# Patient Record
Sex: Female | Born: 1975 | Race: White | Hispanic: Yes | Smoking: Former smoker
Health system: Southern US, Community
[De-identification: ages and names within clinical notes are randomized; demographics above are authoritative.]

## PROBLEM LIST (undated history)

## (undated) ENCOUNTER — Inpatient Hospital Stay (HOSPITAL_COMMUNITY): Payer: Self-pay

## (undated) DIAGNOSIS — F32A Depression, unspecified: Secondary | ICD-10-CM

## (undated) DIAGNOSIS — K029 Dental caries, unspecified: Secondary | ICD-10-CM

## (undated) DIAGNOSIS — R519 Headache, unspecified: Secondary | ICD-10-CM

## (undated) DIAGNOSIS — R51 Headache: Secondary | ICD-10-CM

## (undated) DIAGNOSIS — O283 Abnormal ultrasonic finding on antenatal screening of mother: Secondary | ICD-10-CM

## (undated) DIAGNOSIS — Z789 Other specified health status: Secondary | ICD-10-CM

## (undated) DIAGNOSIS — F329 Major depressive disorder, single episode, unspecified: Secondary | ICD-10-CM

## (undated) HISTORY — PX: MULTIPLE TOOTH EXTRACTIONS: SHX2053

## (undated) HISTORY — PX: ECTOPIC PREGNANCY SURGERY: SHX613

## (undated) HISTORY — DX: Depression, unspecified: F32.A

## (undated) HISTORY — DX: Headache: R51

## (undated) HISTORY — DX: Major depressive disorder, single episode, unspecified: F32.9

## (undated) HISTORY — DX: Headache, unspecified: R51.9

---

## 2000-04-14 ENCOUNTER — Encounter: Payer: Self-pay | Admitting: Emergency Medicine

## 2000-04-14 ENCOUNTER — Inpatient Hospital Stay (HOSPITAL_COMMUNITY): Admission: AD | Admit: 2000-04-14 | Discharge: 2000-04-14 | Payer: Self-pay | Admitting: *Deleted

## 2000-04-15 ENCOUNTER — Inpatient Hospital Stay (HOSPITAL_COMMUNITY): Admission: AD | Admit: 2000-04-15 | Discharge: 2000-04-15 | Payer: Self-pay | Admitting: Obstetrics and Gynecology

## 2000-04-16 ENCOUNTER — Encounter: Payer: Self-pay | Admitting: Obstetrics and Gynecology

## 2000-04-16 ENCOUNTER — Inpatient Hospital Stay (HOSPITAL_COMMUNITY): Admission: AD | Admit: 2000-04-16 | Discharge: 2000-04-17 | Payer: Self-pay | Admitting: Obstetrics and Gynecology

## 2000-04-16 ENCOUNTER — Encounter (INDEPENDENT_AMBULATORY_CARE_PROVIDER_SITE_OTHER): Payer: Self-pay | Admitting: Specialist

## 2000-04-19 ENCOUNTER — Inpatient Hospital Stay (HOSPITAL_COMMUNITY): Admission: AD | Admit: 2000-04-19 | Discharge: 2000-04-19 | Payer: Self-pay | Admitting: *Deleted

## 2001-11-05 ENCOUNTER — Encounter: Admission: RE | Admit: 2001-11-05 | Discharge: 2001-11-05 | Payer: Self-pay | Admitting: Family Medicine

## 2001-11-10 ENCOUNTER — Ambulatory Visit (HOSPITAL_COMMUNITY): Admission: RE | Admit: 2001-11-10 | Discharge: 2001-11-10 | Payer: Self-pay | Admitting: *Deleted

## 2001-11-18 ENCOUNTER — Encounter: Admission: RE | Admit: 2001-11-18 | Discharge: 2001-11-18 | Payer: Self-pay | Admitting: Family Medicine

## 2001-12-01 ENCOUNTER — Encounter: Admission: RE | Admit: 2001-12-01 | Discharge: 2001-12-01 | Payer: Self-pay | Admitting: Family Medicine

## 2001-12-22 ENCOUNTER — Ambulatory Visit (HOSPITAL_COMMUNITY): Admission: RE | Admit: 2001-12-22 | Discharge: 2001-12-22 | Payer: Self-pay | Admitting: Family Medicine

## 2002-01-01 ENCOUNTER — Encounter: Admission: RE | Admit: 2002-01-01 | Discharge: 2002-01-01 | Payer: Self-pay | Admitting: Family Medicine

## 2002-01-13 ENCOUNTER — Ambulatory Visit (HOSPITAL_COMMUNITY): Admission: RE | Admit: 2002-01-13 | Discharge: 2002-01-13 | Payer: Self-pay | Admitting: Family Medicine

## 2002-01-27 ENCOUNTER — Ambulatory Visit (HOSPITAL_COMMUNITY): Admission: RE | Admit: 2002-01-27 | Discharge: 2002-01-27 | Payer: Self-pay | Admitting: *Deleted

## 2002-01-27 ENCOUNTER — Encounter: Payer: Self-pay | Admitting: *Deleted

## 2002-02-02 ENCOUNTER — Encounter: Admission: RE | Admit: 2002-02-02 | Discharge: 2002-02-02 | Payer: Self-pay | Admitting: Family Medicine

## 2002-02-28 ENCOUNTER — Inpatient Hospital Stay (HOSPITAL_COMMUNITY): Admission: AD | Admit: 2002-02-28 | Discharge: 2002-03-02 | Payer: Self-pay | Admitting: *Deleted

## 2002-03-05 ENCOUNTER — Encounter: Admission: RE | Admit: 2002-03-05 | Discharge: 2002-03-05 | Payer: Self-pay | Admitting: Family Medicine

## 2002-03-13 ENCOUNTER — Encounter: Admission: RE | Admit: 2002-03-13 | Discharge: 2002-03-13 | Payer: Self-pay | Admitting: Family Medicine

## 2002-03-18 ENCOUNTER — Encounter: Admission: RE | Admit: 2002-03-18 | Discharge: 2002-03-18 | Payer: Self-pay | Admitting: Family Medicine

## 2002-03-25 ENCOUNTER — Encounter: Admission: RE | Admit: 2002-03-25 | Discharge: 2002-03-25 | Payer: Self-pay | Admitting: Family Medicine

## 2002-04-10 ENCOUNTER — Encounter: Admission: RE | Admit: 2002-04-10 | Discharge: 2002-04-10 | Payer: Self-pay | Admitting: Family Medicine

## 2002-04-23 ENCOUNTER — Encounter: Admission: RE | Admit: 2002-04-23 | Discharge: 2002-04-23 | Payer: Self-pay | Admitting: Family Medicine

## 2002-05-06 ENCOUNTER — Encounter: Admission: RE | Admit: 2002-05-06 | Discharge: 2002-05-06 | Payer: Self-pay | Admitting: Family Medicine

## 2002-05-13 ENCOUNTER — Encounter: Admission: RE | Admit: 2002-05-13 | Discharge: 2002-05-13 | Payer: Self-pay | Admitting: Sports Medicine

## 2002-05-14 ENCOUNTER — Ambulatory Visit (HOSPITAL_COMMUNITY): Admission: RE | Admit: 2002-05-14 | Discharge: 2002-05-14 | Payer: Self-pay | Admitting: Sports Medicine

## 2002-05-22 ENCOUNTER — Encounter: Admission: RE | Admit: 2002-05-22 | Discharge: 2002-05-22 | Payer: Self-pay | Admitting: Family Medicine

## 2002-05-22 ENCOUNTER — Inpatient Hospital Stay (HOSPITAL_COMMUNITY): Admission: AD | Admit: 2002-05-22 | Discharge: 2002-05-22 | Payer: Self-pay | Admitting: Family Medicine

## 2002-05-25 ENCOUNTER — Encounter: Admission: RE | Admit: 2002-05-25 | Discharge: 2002-05-25 | Payer: Self-pay | Admitting: Family Medicine

## 2002-05-29 ENCOUNTER — Encounter (HOSPITAL_COMMUNITY): Admission: RE | Admit: 2002-05-29 | Discharge: 2002-06-05 | Payer: Self-pay | Admitting: *Deleted

## 2002-06-03 ENCOUNTER — Encounter: Admission: RE | Admit: 2002-06-03 | Discharge: 2002-06-03 | Payer: Self-pay | Admitting: Family Medicine

## 2002-06-05 ENCOUNTER — Inpatient Hospital Stay (HOSPITAL_COMMUNITY): Admission: AD | Admit: 2002-06-05 | Discharge: 2002-06-08 | Payer: Self-pay | Admitting: Family Medicine

## 2002-07-10 ENCOUNTER — Encounter: Admission: RE | Admit: 2002-07-10 | Discharge: 2002-07-10 | Payer: Self-pay | Admitting: Family Medicine

## 2002-09-01 ENCOUNTER — Encounter: Admission: RE | Admit: 2002-09-01 | Discharge: 2002-09-01 | Payer: Self-pay | Admitting: Family Medicine

## 2002-10-07 ENCOUNTER — Encounter: Admission: RE | Admit: 2002-10-07 | Discharge: 2002-10-07 | Payer: Self-pay | Admitting: Family Medicine

## 2002-11-17 ENCOUNTER — Encounter: Admission: RE | Admit: 2002-11-17 | Discharge: 2002-11-17 | Payer: Self-pay | Admitting: Family Medicine

## 2003-04-03 ENCOUNTER — Emergency Department (HOSPITAL_COMMUNITY): Admission: EM | Admit: 2003-04-03 | Discharge: 2003-04-03 | Payer: Self-pay | Admitting: Emergency Medicine

## 2004-04-13 ENCOUNTER — Ambulatory Visit: Payer: Self-pay | Admitting: Family Medicine

## 2004-04-17 ENCOUNTER — Ambulatory Visit (HOSPITAL_COMMUNITY): Admission: RE | Admit: 2004-04-17 | Discharge: 2004-04-17 | Payer: Self-pay | Admitting: Family Medicine

## 2004-05-16 ENCOUNTER — Ambulatory Visit: Payer: Self-pay | Admitting: Family Medicine

## 2004-05-19 ENCOUNTER — Ambulatory Visit (HOSPITAL_COMMUNITY): Admission: RE | Admit: 2004-05-19 | Discharge: 2004-05-19 | Payer: Self-pay | Admitting: *Deleted

## 2004-06-05 ENCOUNTER — Ambulatory Visit: Payer: Self-pay | Admitting: Family Medicine

## 2004-06-19 ENCOUNTER — Ambulatory Visit (HOSPITAL_COMMUNITY): Admission: RE | Admit: 2004-06-19 | Discharge: 2004-06-19 | Payer: Self-pay | Admitting: Family Medicine

## 2004-06-30 ENCOUNTER — Ambulatory Visit: Payer: Self-pay | Admitting: Sports Medicine

## 2004-07-11 ENCOUNTER — Ambulatory Visit: Payer: Self-pay | Admitting: Family Medicine

## 2004-07-21 ENCOUNTER — Ambulatory Visit: Payer: Self-pay | Admitting: Family Medicine

## 2004-07-27 ENCOUNTER — Ambulatory Visit: Payer: Self-pay | Admitting: Family Medicine

## 2004-08-03 ENCOUNTER — Ambulatory Visit: Payer: Self-pay | Admitting: Family Medicine

## 2004-08-08 ENCOUNTER — Inpatient Hospital Stay (HOSPITAL_COMMUNITY): Admission: AD | Admit: 2004-08-08 | Discharge: 2004-08-08 | Payer: Self-pay | Admitting: *Deleted

## 2004-08-08 ENCOUNTER — Ambulatory Visit: Payer: Self-pay | Admitting: Family Medicine

## 2004-08-10 ENCOUNTER — Encounter (INDEPENDENT_AMBULATORY_CARE_PROVIDER_SITE_OTHER): Payer: Self-pay | Admitting: *Deleted

## 2004-08-10 ENCOUNTER — Ambulatory Visit: Payer: Self-pay | Admitting: Family Medicine

## 2004-08-10 ENCOUNTER — Ambulatory Visit: Payer: Self-pay | Admitting: Obstetrics & Gynecology

## 2004-08-10 ENCOUNTER — Inpatient Hospital Stay (HOSPITAL_COMMUNITY): Admission: AD | Admit: 2004-08-10 | Discharge: 2004-08-12 | Payer: Self-pay | Admitting: Obstetrics & Gynecology

## 2004-10-19 ENCOUNTER — Ambulatory Visit: Payer: Self-pay | Admitting: Family Medicine

## 2004-12-28 ENCOUNTER — Ambulatory Visit: Payer: Self-pay | Admitting: Sports Medicine

## 2005-05-11 ENCOUNTER — Ambulatory Visit: Payer: Self-pay | Admitting: Sports Medicine

## 2005-05-18 ENCOUNTER — Ambulatory Visit: Payer: Self-pay | Admitting: Family Medicine

## 2005-06-01 ENCOUNTER — Ambulatory Visit: Payer: Self-pay | Admitting: Family Medicine

## 2005-06-07 ENCOUNTER — Ambulatory Visit: Payer: Self-pay | Admitting: Sports Medicine

## 2005-06-12 ENCOUNTER — Encounter: Admission: RE | Admit: 2005-06-12 | Discharge: 2005-06-12 | Payer: Self-pay | Admitting: Sports Medicine

## 2005-06-22 ENCOUNTER — Ambulatory Visit: Payer: Self-pay | Admitting: Family Medicine

## 2006-07-11 ENCOUNTER — Ambulatory Visit: Payer: Self-pay | Admitting: Sports Medicine

## 2006-07-23 ENCOUNTER — Encounter (INDEPENDENT_AMBULATORY_CARE_PROVIDER_SITE_OTHER): Payer: Self-pay | Admitting: *Deleted

## 2006-07-23 LAB — CONVERTED CEMR LAB

## 2006-08-15 ENCOUNTER — Encounter (INDEPENDENT_AMBULATORY_CARE_PROVIDER_SITE_OTHER): Payer: Self-pay | Admitting: Family Medicine

## 2006-08-15 ENCOUNTER — Ambulatory Visit: Payer: Self-pay | Admitting: Family Medicine

## 2006-09-20 ENCOUNTER — Encounter (INDEPENDENT_AMBULATORY_CARE_PROVIDER_SITE_OTHER): Payer: Self-pay | Admitting: *Deleted

## 2007-02-19 LAB — OB RESULTS CONSOLE VARICELLA ZOSTER ANTIBODY, IGG: Varicella: IMMUNE

## 2007-02-19 LAB — SICKLE CELL SCREEN: SICKLE CELL SCREEN: NORMAL

## 2007-02-27 ENCOUNTER — Ambulatory Visit (HOSPITAL_COMMUNITY): Admission: RE | Admit: 2007-02-27 | Discharge: 2007-02-27 | Payer: Self-pay | Admitting: Family Medicine

## 2007-06-27 ENCOUNTER — Ambulatory Visit (HOSPITAL_COMMUNITY): Admission: RE | Admit: 2007-06-27 | Discharge: 2007-06-27 | Payer: Self-pay | Admitting: Obstetrics & Gynecology

## 2007-07-02 ENCOUNTER — Ambulatory Visit: Payer: Self-pay | Admitting: Obstetrics & Gynecology

## 2007-07-05 ENCOUNTER — Inpatient Hospital Stay (HOSPITAL_COMMUNITY): Admission: AD | Admit: 2007-07-05 | Discharge: 2007-07-07 | Payer: Self-pay | Admitting: Obstetrics & Gynecology

## 2007-07-05 ENCOUNTER — Ambulatory Visit: Payer: Self-pay | Admitting: *Deleted

## 2007-07-11 ENCOUNTER — Encounter (INDEPENDENT_AMBULATORY_CARE_PROVIDER_SITE_OTHER): Payer: Self-pay | Admitting: Family Medicine

## 2009-06-21 ENCOUNTER — Encounter: Payer: Self-pay | Admitting: *Deleted

## 2009-06-21 ENCOUNTER — Emergency Department (HOSPITAL_COMMUNITY): Admission: EM | Admit: 2009-06-21 | Discharge: 2009-06-21 | Payer: Self-pay | Admitting: Emergency Medicine

## 2009-07-21 ENCOUNTER — Encounter (INDEPENDENT_AMBULATORY_CARE_PROVIDER_SITE_OTHER): Payer: Self-pay | Admitting: Family Medicine

## 2009-07-21 ENCOUNTER — Ambulatory Visit: Payer: Self-pay | Admitting: Family Medicine

## 2010-08-13 ENCOUNTER — Encounter: Payer: Self-pay | Admitting: Obstetrics & Gynecology

## 2010-12-08 NOTE — Discharge Summary (Signed)
[facility]  Patient:    Amanda Beard                     MRN: [id number] Adm. Date:  [id number] Disc. Date: [id number] Attending:  Cleatrice Burke                           Discharge Summary  DISCHARGE DIAGNOSES:          1. Right cornual ectopic pregnancy.                               2. Hemoperitoneum.                               3. Anemia.  PROCEDURES THIS ADMISSION:    On April 16, 2000:                               1. Diagnostic laparoscopy.                               2. Exploratory laparotomy with right cornual                                  resection.  HISTORY OF PRESENT ILLNESS:   Ms. Amanda Beard is a 35 year old female, gravida 1 para 0, who had the presumed diagnosis of an ectopic pregnancy.  She was treated with methotrexate on April 15, 2000.  She presented to the hospital on the day of admission complaining of severe lower abdominal pain. Please see her dictated history and physical exam for details.  ADMISSION PHYSICAL EXAMINATION:                  The abdomen was markedly tender and there was rebound tenderness.  HOSPITAL COURSE:              The patient had an ultrasound performed, which showed a questionable mass in the adnexa, and fluid in the cul-de-sac consistent with a hemoperitoneum.  The decision was made to proceed with diagnostic laparoscopy because of a presumed ruptured ectopic pregnancy.  At the time of laparoscopy, the patient was found to have a cornual ectopic pregnancy on the right that measured 4 cm.  There was a 50 cc hemoperitoneum present.  We then proceed with exploratory laparotomy with a right cornual resection.  The patient tolerated her procedure well.  She remained afebrile postoperatively.  Her postoperative hemoglobin was 9.7 (preoperative hemoglobin 11.9).  She quickly tolerated a regular diet.  She was discharged to home on April 17, 2000, doing well.  DISCHARGE MEDICATIONS:         The patient was given Vicodin for pain.  FOLLOW-UP:                    She will return to see Dr. Stefano Gaul in four to six weeks for follow-up examination. DD:  06/05/00 TD:  06/05/00 Job: [id number]

## 2010-12-08 NOTE — Discharge Summary (Signed)
NAME:  Amanda Beard                ACCOUNT NO.:  [id number]   MEDICAL RECORD NO.:  [id number]                   PATIENT TYPE:  OUT   LOCATION:  ULT                                  FACILITY:  [facility]   PHYSICIAN:  Dianah Field, M.D.                 DATE OF BIRTH:  05/28/1976   DATE OF ADMISSION:  02/28/2002  DATE OF DISCHARGE:  03/02/2002                                 DISCHARGE SUMMARY   DISCHARGE DIAGNOSES:  1. Suspected preterm labor.  2. Intrauterine pregnancy at 87 and 6/7 weeks.   HOSPITAL COURSE:  This is a 35 year old G2, P0-0-1-0 who presented to  maternity admissions unit with complaints of abdominal pain and vaginal  discharge.  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  The patient's initial examination  revealed a negative wet prep, positive ferning, and negative pooling.  Digital cervical examination revealed closed cervix.  The patient was  admitted for presumed preterm rupture of membranes.  The patient received  two doses of betamethasone, first dose on February 28, 2002 and second dose on  March 01, 2002.  She was placed on strict bed rest and monitored for  uterine contractions.  The patient did not have any continued leakage of  fluid and tocometry was negative for any contractions.  Fetal heart tones  were within normal range.  Repeat speculum examination on March 02, 2002  was negative for pooling, Nitrazine negative, and negative for ferning.  Follow-up ultrasound revealed no significant change in AFI.  The patient was  currently stable for discharge home and presumptive diagnosis of preterm  premature rupture of membranes is currently suspected; however, confirmation  has not been repeated.  The patient will be followed closely as an  outpatient and has pelvic rest restrictions.  She is not discharged on any  oral medications aside from prenatal vitamins.   DATA:  CBC:  Hemoglobin  12.8, hematocrit 37.9, white blood cell count 8.7,  platelet count 182,000.  Urinalysis is negative.  Wet prep:  Moderate white  blood cells, negative for clue cells, yeast, and [place].  Repeat wet  prep was negative aside from two white blood cells seen.  Group B Strep is  negative.  RPR is nonreactive.  GC and Chlamydia are negative.   Initial ultrasound obtained February 28, 2002:  Fetus appears to have fitus  inversus with the stomach on the right and the apex of the heart on the  right side.  AFI is estimated at 16.1 cm.  Cervix is 3.2 cm measured  translabially.  Mean gestational age is consistent with 28 weeks.   Repeat __________  OB ultrasound March 02, 2002:  Normal AFI estimated at  15.1 cm, cervix 3.0 cm.   DISCHARGE MEDICATIONS:  Prenatal vitamins.   DISCHARGE INSTRUCTIONS:  The patient is to follow up at [facility] in one week and will call for this  appointment.  She is to maintain  strict pelvic rest including nothing per vagina and no sexual intercourse.  She has no restrictions on her work or activity otherwise.  She is to return  to triage or [facility] for any leakage of fluid, any fevers, or  any other concerns.                                               Dianah Field, M.D.   SG/MEDQ  D:  03/02/2002  T:  03/04/2002  Job:  [id number]

## 2010-12-08 NOTE — Op Note (Signed)
[facility]  Patient:    Amanda Beard                     MRN: [id number] Proc. Date: 04/16/00 Adm. Date:  [id number] Disc. Date: [id number] Attending:  Dierdre Forth Pearline                           Operative Report  PREOPERATIVE DIAGNOSIS:       Ruptured right ectopic pregnancy.  POSTOPERATIVE DIAGNOSIS:      Right cornual ectopic pregnancy with a                               hemoperitoneum.  OPERATION:                    1. Diagnostic laparoscopy.                               2. Right cornual resection.  SURGEON:                      Janine Limbo, M.D.  ASSISTANT:                    Wynelle Bourgeois, C.N.M.  ANESTHESIA:                   General anesthesia  ESTIMATED BLOOD LOSS:         50 cc.  INDICATIONS:                  The patient is a 35 year old female, gravida 1, para 0, who was diagnosed with an ectopic pregnancy on April 14, 2000. She was given methotrexate.  The patient reports that at noon today she began having severe abdominal pain.  She presented to [facility] where she had an ultrasound performed that showed a mass on the right but also showed fluid in the cul-de-sac. Through an interpreter, we carefully explained the possibility that she had a ruptured right ectopic pregnancy and we made plans to proceed with diagnostic laparoscopy and possible laparoscopic salpingectomy.  The risks and benefits of her procedure were reviewed including, but not limited to, anesthetic complications, bleeding, infections, and possible damage to the surrounding organs.  FINDINGS:                     The patient had a 4 cm ectopic pregnancy in the right cornual region of the uterus. There was a 50 cc hemoperitoneum present. The ovaries appeared normal.  The uterus otherwise appeared normal.  The patients blood type is O positive.  The patients hemoglobin on the day of this surgery was 11.9.  Her white blood cell count was  9000.  Her platelet count was 207,000.  It is my recommendation that should this patient get pregnant in the future, that a cesarean section be performed.  DESCRIPTION OF PROCEDURE:     The patient was taken to the operating room where a general anesthetic was given.  The patients abdomen, perineum, and vagina were prepped with multiple layers of Betadine. Examination under anesthesia was performed.  A Foley catheter was placed in the bladder.  A Hulka tenaculum was placed inside the uterus. The patient was then sterilely draped.  The subumbilical area was injected  with 0.25% Marcaine.  An incision was made in the subumbilical area and the Verres needle was inserted without difficulty. Proper placement was confirmed using the saline drop test.  A pneumoperitoneum was then obtained. The laparoscopic trocar and then the laparoscope were substituted for the Verres needle. The pelvic structures were visualized with findings as mentioned above.  Pictures were taken of the patients anatomy. The decision was made to proceed with laparotomy with cornual resection. The patients abdomen was then opened through a small Pfannenstiel incision.  The incision was extended through the subcutaneous tissue, the fascia and the anterior peritoneum.  An abdominal wall retractor was placed. The cornual pregnancy on the right was then isolated using interrupted sutures of 0 PDS.  The pregnancy was then sharply excised. Hemostasis was achieved by tying the PDS sutures and also using figure-of-eight sutures of 0 Vicryl.  The pelvis was then vigorously irrigated.  Hemostasis was noted to be adequate.  All instruments were removed.  The fascia was closed using a running suture of 0 Vicryl.  The subcutaneous tissue was irrigated.  Hemostasis was adequate.  The skin was reapproximated using skin staples.  Sponge, needle and instrument counts were correct on two occasions.  The estimated blood loss was 50 cc.  The  patient tolerated the procedure well.  She was awakened from her anesthetic and taken to the recovery room in stable condition. DD:  04/16/00 TD:  04/17/00 Job: [id number]

## 2010-12-08 NOTE — H&P (Signed)
[facility]  Patient:    Amanda Beard                     MRN: [id number] Adm. Date:  [id number] Attending:  Shaune Spittle Dictator:   Amanda Beard, C.N.M.                         History and Physical  DATE OF BIRTH:                May 28, 1976  HISTORY OF PRESENT ILLNESS:   Ms. Amanda Beard is a 35 year old gravida 1, para 0 with a diagnosed ectopic pregnancy on ultrasound this week who presents with sudden onset of severe lower abdominal pain at approximately 12 noon today. She also reports a small amount of vaginal bleeding. She received her first dose of methotrexate on April 15, 2000. She has a known O positive blood type. She is writhing in pain on her arrival. She had an ultrasound at [facility] through the ER on September 23 showing a 3 x 4 cm structure at the apex of the uterus felt to represent an ectopic pregnancy.  LABORATORY DATA:              Blood type is O positive. CBC is pending at this time. CBC on April 14, 2000, showed a hemoglobin of 12.7 and a hematocrit of 36.3, WBC count of 6.9, platelet count of 201. She had a quantitative HCG which showed a value of 4938 at that time. Urine showed 15, no ketones, large blood. She had a negative wet prep. GC and Chlamydia cultures were done at that time which are still pending.  ALLERGIES:                    None.  CURRENT MEDICATIONS:          None.  PAST MEDICAL HISTORY:         She has had no previous gynecological abnormalities. She has no history of diabetes, hypertension, thyroid disease, or any gastrointestinal problems. She has never had any surgery.  SOCIAL HISTORY:               She is single. Her brother and her friend are currently with her. The patient does not speak English, neither does her brother. Her friend Varney Baas does speak English. The patient denies any alcohol or drug use. She does smoke approximately two cigarettes per  day.  FAMILY HISTORY:               Not significant for hypertension, diabetes, thyroid issues, or any other problems, particularly any difficulty with anesthesia.  PHYSICAL EXAMINATION:  VITAL SIGNS:                  Stable; the patient is afebrile.  HEENT:                        Within normal limits.  LUNGS:                        Breath sounds are clear.  HEART:                        Regular rate and rhythm without murmur.  BREASTS:  Soft and nontender.  ABDOMEN:                      Markedly tender to minimal palpation. Faint bowel sounds are noted. There is difficulty in differentiating generalized abdominal tenderness from rebound but it is difficult to do a full abdominal exam secondary to the patients pain issues.  PELVIC:                       Deferred, except for a small amount of vaginal bleeding noted.  EXTREMITIES:                  Deep tendon reflexes are 2+ without clonus. There is a trace to no edema noted.  IMPRESSION:                   1. Ectopic pregnancy.                               2. Sudden-onset, severe abdominal pain.  PLAN:                         1. Consulted with Janine Limbo, M.D. as todays on-call attending physician.                               2. IV of ______ initiated.                               3. CBC and ______ drawn.                               4. Stadol 1 mg IV was given.                               5. Consent form signed for diagnostic laparoscopy with possible salpingectomy. Risks and benefits of this procedure were reviewed with the patient via her friend as interpreter.                               6. Janine Limbo, M.D. will see and further determine plan of care. DD:  04/16/00 TD:  04/16/00 Job: [id number]

## 2011-04-27 LAB — HERPES SIMPLEX VIRUS CULTURE: Culture: NOT DETECTED

## 2011-04-30 LAB — CBC
HCT: 31 — ABNORMAL LOW
HCT: 35.4 — ABNORMAL LOW
Hemoglobin: 10.8 — ABNORMAL LOW
Hemoglobin: 12.2
MCHC: 34.6
MCHC: 34.8
MCV: 95.2
MCV: 95.2
Platelets: 139 — ABNORMAL LOW
Platelets: 145 — ABNORMAL LOW
RBC: 3.25 — ABNORMAL LOW
RBC: 3.72 — ABNORMAL LOW
RDW: 14
RDW: 14.3
WBC: 10.2
WBC: 9.2

## 2011-04-30 LAB — RPR: RPR Ser Ql: NONREACTIVE

## 2015-09-29 ENCOUNTER — Encounter (HOSPITAL_COMMUNITY): Payer: Self-pay | Admitting: *Deleted

## 2015-09-29 ENCOUNTER — Emergency Department (INDEPENDENT_AMBULATORY_CARE_PROVIDER_SITE_OTHER)
Admission: EM | Admit: 2015-09-29 | Discharge: 2015-09-29 | Disposition: A | Discharge status: Home / Self Care | Payer: Self-pay | Source: Home / Self Care | Attending: Emergency Medicine | Admitting: Emergency Medicine

## 2015-09-29 DIAGNOSIS — J111 Influenza due to unidentified influenza virus with other respiratory manifestations: Secondary | ICD-10-CM

## 2015-09-29 NOTE — ED Provider Notes (Signed)
CSN: [id number]     Arrival date & time 09/29/15  1751 History   First MD Initiated Contact with Patient 09/29/15 1858     No chief complaint on file.  (Consider location/radiation/quality/duration/timing/severity/associated sxs/prior Treatment) HPI Pt presents with sore throat, body aches, fever, chills and diarrhea since last Friday. Home treatment has been OTC meds without much relief of symptoms Fever is improved for short periods of time with OTC antipyretics. Pain score is 4 mostly from coughing and body aches Taking fluids, no appetite No flu shot Has been exposed to others with similar symptoms.  Denies: CP, SOB, vomiting   No past medical history on file. No past surgical history on file. No family history on file. Social History  Substance Use Topics  . Smoking status: Not on file  . Smokeless tobacco: Not on file  . Alcohol Use: Not on file   OB History    No data available     Review of Systems See HPI Allergies  Review of patient's allergies indicates not on file.  Home Medications   Prior to Admission medications   Not on File   Meds Ordered and Administered this Visit  Medications - No data to display  There were no vitals taken for this visit. No data found.   Physical Exam NURSES NOTES AND VITAL SIGNS REVIEWED. CONSTITUTIONAL: Well developed, well nourished, no acute distress HEENT: normocephalic, atraumatic, right and left TM's are normal EYES: Conjunctiva normal NECK:normal ROM, supple, no adenopathy PULMONARY:No respiratory distress, normal effort, Lungs: CTAb/l, no wheezes, or increased work of breathing CARDIOVASCULAR: RRR, no murmur ABDOMEN: soft, ND, NT, +'ve BS MUSCULOSKELETAL: Normal ROM of all extremities,  SKIN: warm and dry without rash PSYCHIATRIC: Mood and affect, behavior are normal  ED Course  Procedures (including critical care time)  Labs Review Labs Reviewed - No data to display  Imaging Review No results  found.   Visual Acuity Review  Right Eye Distance:   Left Eye Distance:   Bilateral Distance:    Right Eye Near:   Left Eye Near:    Bilateral Near:         MDM  No diagnosis found.  Patient is reassured that there are no issues that require transfer to higher level of care at this time or additional tests. Patient is advised to continue home symptomatic treatment. Patient is advised that if there are new or worsening symptoms to attend the emergency department, contact primary care provider, or return to UC. Instructions of care provided discharged home in stable condition. Return to work/school note provided.   THIS NOTE WAS GENERATED USING A VOICE RECOGNITION SOFTWARE PROGRAM. ALL REASONABLE EFFORTS  WERE MADE TO PROOFREAD THIS DOCUMENT FOR ACCURACY.  I have verbally reviewed the discharge instructions with the patient. A printed AVS was given to the patient.  All questions were answered prior to discharge.      Tharon AquasFrank C Marvie Brevik, PA 09/29/15 1924

## 2015-09-29 NOTE — ED Notes (Signed)
Cough    Fever     X  5  Days      Pt  Reports     Body  Aches    And  Headache   As   Well

## 2015-09-29 NOTE — Discharge Instructions (Signed)
Gripe - Adultos  (Influenza, Adult)  La gripe es una infección viral del tracto respiratorio. Ocurre con más frecuencia en los meses de invierno, ya que las personas pasan más tiempo en contacto cercano. La gripe puede enfermarlo considerablemente. Se transmite fácilmente de una persona a otra (es contagiosa).  CAUSAS   La causa es un virus que infecta el tracto respiratorio. Puede contagiarse el virus al aspirar las gotitas que una persona infectada elimina al toser o estornudar. También puede contagiarse al tocar algo que fue recientemente contaminado con el virus y luego llevarse la mano a la boca, la nariz o los ojos.  RIESGOS Y COMPLICACIONES  Tendrá mayor riesgo de sufrir un resfrío grave si consume cigarrillos, es diabético, sufre una enfermedad cardíaca (como insuficiencia cardíaca) o pulmonar crónica (como asma) o si tiene debilitado el sistema inmunológico. Los ancianos y las mujeres embarazadas tienen más riesgo de sufrir infecciones graves. El problema más frecuente de la gripe es la infección pulmonar (neumonía). En algunos casos, este problema puede requerir atención médica de emergencia y poner en peligro la vida.  SIGNOS Y SÍNTOMAS   Los síntomas pueden durar entre 4 y 10 días y pueden ser:  · Fiebre.  · Escalofríos.  · Dolor de cabeza, dolores en el cuerpo y musculares.  · Dolor de garganta.  · Molestias en el pecho y tos.  · Pérdida del apetito.  · Debilidad o cansancio.  · Mareos.  · Náuseas o vómitos.  DIAGNÓSTICO   El diagnóstico se realiza según su historia clínica y un examen físico. Es necesario realizar un análisis de cultivo faríngeo o nasal para confirmar el diagnóstico.  TRATAMIENTO   En los casos leves, la gripe se cura sin tratamiento. El tratamiento está dirigido a aliviar los síntomas. En los casos más graves, el médico podrá recetar medicamentos antivirales para acortar el curso de la enfermedad. Los antibióticos no son eficaces, ya que la infección está causada por un virus y no una  bacteria.  INSTRUCCIONES PARA EL CUIDADO EN EL HOGAR   · Tome los medicamentos solamente como se lo haya indicado el médico.  · Utilice un humidificador de niebla fría para facilitar la respiración.  · Haga reposo hasta que la temperatura vuelva a ser normal. Generalmente esto lleva entre 3 y 4 días.  · Beba suficiente líquido para mantener la orina clara o de color amarillo pálido.  · Cúbrase la boca y la nariz al toser o estornudar, y lávese las manos muy bien para evitar que se propague el virus.  · Quédese en su casa y no concurra al trabajo o a la escuela hasta que la fiebre haya desaparecido al menos por un día completo.  PREVENCIÓN   La vacunación anual contra la gripe es la mejor manera de evitar enfermarse. Se recomienda ahora de manera rutinaria una vacuna anual contra la gripe a todos los adultos estadounidenses.  SOLICITE ATENCIÓN MÉDICA SI:  · Tiene dolor en el pecho, la tos empeora o tiene más mucosidad.  · Tiene náuseas, vómitos o diarrea.  · La fiebre regresa o empeora.  SOLICITE ATENCIÓN MÉDICA DE INMEDIATO SI:   · Tiene dificultad para respirar, le falta el aire o tiene la piel o las uñas azuladas.  · Presenta dolor intenso o entumecimiento en el cuello.  · Le duele la cabeza de forma repentina o tiene dolor en la cara o el oído.  · Tiene náuseas o vómitos que no puede controlar.  ASEGÚRESE DE QUE:   · Comprende   Document Released: 04/18/2005 Document Revised: 07/30/2014 Elsevier Interactive Patient Education 2016 [employer]  Tos en los adultos (Cough, Adult) La tos es un reflejo que limpia la garganta y las vas respiratorias, y ayuda a la curacin y Training and development officerla proteccin de los pulmones. Es normal toser de Teacher, English as a foreign languagevez en cuando, pero cuando esta se presenta con  otros sntomas o dura mucho tiempo puede ser el signo de una enfermedad que [place] tratamiento. La tos puede durar solo 2 o 3semanas (aguda) o ms de 8semanas (crnica). CAUSAS Comnmente, las causas de la tos son las siguientes:  Visual merchandisernhalar sustancias que Sealed Air Corporationirritan los pulmones.  Una infeccin respiratoria viral o bacteriana.  Alergias.  Asma.  Goteo posnasal.  Fumar.  El retroceso de cido estomacal hacia el esfago (reflujo gastroesofgico).  Algunos medicamentos.  Los problemas pulmonares crnicos, entre ellos, la enfermedad pulmonar obstructiva crnica (EPOC) (o, en contadas ocasiones, el cncer de pulmn).  Otras afecciones, como la insuficiencia cardaca. INSTRUCCIONES PARA EL CUIDADO EN EL HOGAR  Est atento a cualquier cambio en los sntomas. Tome estas medidas para Paramedicaliviar las molestias:  Tome los medicamentos solamente como se lo haya indicado el mdico.  Si le recetaron un antibitico, tmelo como se lo haya indicado el mdico. No deje de tomar los antibiticos aunque comience a sentirse mejor.  Hable con el mdico antes de tomar un antitusivo.  Beba suficiente lquido para Photographermantener la orina clara o de color amarillo plido.  Si el aire est seco, use un vaporizador o un humidificador con vapor fro en su habitacin o en su casa para ayudar a aflojar las secreciones.  Evite todas las cosas que le producen tos en el trabajo o en su casa.  Si la tos aumenta durante la noche, intente dormir semisentado.  Evite el humo del cigarrillo. Si fuma, deje de hacerlo. Si necesita ayuda para dejar de fumar, consulte al mdico.  Evite la cafena.  Evite el alcohol.  Descanse todo lo que sea necesario. SOLICITE ATENCIN MDICA SI:   Aparecen nuevos sntomas.  Expectora pus al toser.  La tos no mejora despus de 2 o 3semanas, o empeora.  No puede controlar la tos con antitusivos y no puede dormir bien.  Tiene un dolor que se intensifica o que no puede Tour managercontrolar  con analgsicos.  Tiene fiebre.  Baja de peso sin causa aparente.  Tiene transpiracin nocturna. SOLICITE ATENCIN MDICA DE INMEDIATO SI:  Tose y escupe sangre.  Tiene dificultad para respirar.  Los latidos cardacos son muy rpidos.   Esta informacin no tiene Theme park managercomo fin reemplazar el consejo del mdico. Asegrese de hacerle al mdico cualquier pregunta que tenga.   Document Released: 02/14/2011 Document Revised: 03/30/2015 Elsevier Interactive Patient Education [employer] Elsevier Inc.

## 2016-07-20 ENCOUNTER — Inpatient Hospital Stay (HOSPITAL_COMMUNITY): Payer: Self-pay

## 2016-07-20 ENCOUNTER — Encounter (HOSPITAL_COMMUNITY): Payer: Self-pay | Admitting: *Deleted

## 2016-07-20 ENCOUNTER — Inpatient Hospital Stay (HOSPITAL_COMMUNITY)
Admission: AD | Admit: 2016-07-20 | Discharge: 2016-07-20 | Disposition: A | Discharge status: Home / Self Care | Payer: Self-pay | Source: Ambulatory Visit | Attending: Obstetrics and Gynecology | Admitting: Obstetrics and Gynecology

## 2016-07-20 DIAGNOSIS — Z3A01 Less than 8 weeks gestation of pregnancy: Secondary | ICD-10-CM

## 2016-07-20 DIAGNOSIS — R05 Cough: Secondary | ICD-10-CM | POA: Insufficient documentation

## 2016-07-20 DIAGNOSIS — R109 Unspecified abdominal pain: Secondary | ICD-10-CM | POA: Insufficient documentation

## 2016-07-20 DIAGNOSIS — Z87891 Personal history of nicotine dependence: Secondary | ICD-10-CM | POA: Insufficient documentation

## 2016-07-20 DIAGNOSIS — O98511 Other viral diseases complicating pregnancy, first trimester: Secondary | ICD-10-CM | POA: Insufficient documentation

## 2016-07-20 DIAGNOSIS — J069 Acute upper respiratory infection, unspecified: Secondary | ICD-10-CM

## 2016-07-20 DIAGNOSIS — O26891 Other specified pregnancy related conditions, first trimester: Secondary | ICD-10-CM | POA: Insufficient documentation

## 2016-07-20 DIAGNOSIS — R509 Fever, unspecified: Secondary | ICD-10-CM | POA: Insufficient documentation

## 2016-07-20 DIAGNOSIS — B349 Viral infection, unspecified: Secondary | ICD-10-CM | POA: Insufficient documentation

## 2016-07-20 DIAGNOSIS — Z3491 Encounter for supervision of normal pregnancy, unspecified, first trimester: Secondary | ICD-10-CM

## 2016-07-20 DIAGNOSIS — O26899 Other specified pregnancy related conditions, unspecified trimester: Secondary | ICD-10-CM

## 2016-07-20 HISTORY — DX: Other specified health status: Z78.9

## 2016-07-20 LAB — CBC
HCT: 33.4 % — ABNORMAL LOW (ref 36.0–46.0)
Hemoglobin: 12 g/dL (ref 12.0–15.0)
MCH: 32.6 pg (ref 26.0–34.0)
MCHC: 35.9 g/dL (ref 30.0–36.0)
MCV: 90.8 fL (ref 78.0–100.0)
Platelets: 172 10*3/uL (ref 150–400)
RBC: 3.68 MIL/uL — ABNORMAL LOW (ref 3.87–5.11)
RDW: 12.8 % (ref 11.5–15.5)
WBC: 6.7 10*3/uL (ref 4.0–10.5)

## 2016-07-20 LAB — WET PREP, GENITAL
Clue Cells Wet Prep HPF POC: NONE SEEN
Sperm: NONE SEEN
TRICH WET PREP: NONE SEEN
YEAST WET PREP: NONE SEEN

## 2016-07-20 LAB — URINALYSIS, ROUTINE W REFLEX MICROSCOPIC
Bilirubin Urine: NEGATIVE
Glucose, UA: NEGATIVE mg/dL
Hgb urine dipstick: NEGATIVE
Ketones, ur: NEGATIVE mg/dL
Leukocytes, UA: NEGATIVE
Nitrite: NEGATIVE
Protein, ur: NEGATIVE mg/dL
Specific Gravity, Urine: 1.021 (ref 1.005–1.030)
pH: 7 (ref 5.0–8.0)

## 2016-07-20 LAB — INFLUENZA PANEL BY PCR (TYPE A & B)
Influenza A By PCR: NEGATIVE
Influenza B By PCR: NEGATIVE

## 2016-07-20 LAB — RAPID STREP SCREEN (MED CTR MEBANE ONLY): Streptococcus, Group A Screen (Direct): NEGATIVE

## 2016-07-20 LAB — ABO/RH: ABO/RH(D): O POS

## 2016-07-20 LAB — POCT PREGNANCY, URINE: Preg Test, Ur: POSITIVE — AB

## 2016-07-20 LAB — HCG, QUANTITATIVE, PREGNANCY: HCG, BETA CHAIN, QUANT, S: 96050 m[IU]/mL — AB (ref <5)

## 2016-07-20 NOTE — MAU Provider Note (Signed)
History     CSN: [id number]  Arrival date and time: 07/20/16 1354   First Provider Initiated Contact with Patient 07/20/16 1521      Chief Complaint  Patient presents with  . Cough  . Abdominal Pain  . Fever   [id number] @[redacted]w[redacted]d  by LMP here with nasal congestion, productive cough, and sore throat x5 days. She reports fever, chills, and body aches 2 days ago. She did not check with a thermometer. She also reports vomiting last night. She also c/o lower abdominal pain x2 days. She describes pain as feeling like pressure initially only with coughing but today feels like contractions and now has back discomfort. She denies VB but reports yellow vaginal discharge x2 weeks. She denies malodor and itching.    OB History    Gravida Para Term Preterm AB Living   5 3 3   1 3    SAB TAB Ectopic Multiple Live Births       1          Past Medical History:  Diagnosis Date  . Medical history non-contributory     Past Surgical History:  Procedure Laterality Date  . ECTOPIC PREGNANCY SURGERY      No family history on file.  Social History  Substance Use Topics  . Smoking status: Former Smoker    Quit date: 07/23/2000  . Smokeless tobacco: Never Used  . Alcohol use No    Allergies: No Known Allergies  No prescriptions prior to admission.    Review of Systems  Constitutional: Positive for chills, fever and malaise/fatigue.  HENT: Positive for congestion and sore throat.   Respiratory: Positive for cough, sputum production (yellow) and shortness of breath.   Gastrointestinal: Positive for vomiting. Negative for constipation and diarrhea.  Genitourinary: Negative.   Neurological: Positive for headaches.   Physical Exam   Blood pressure 122/76, pulse 82, temperature 98.3 F (36.8 C), resp. rate 18, height 5\' 5"  (1.651 m), weight 68 kg (150 lb), last menstrual period 05/08/2016.  Physical Exam  Constitutional: She is oriented to person, place, and time. She appears well-developed  and well-nourished. No distress.  HENT:  Head: Normocephalic and atraumatic.  Nose: Right sinus exhibits maxillary sinus tenderness and frontal sinus tenderness. Left sinus exhibits maxillary sinus tenderness and frontal sinus tenderness.  Mouth/Throat: Uvula is midline, oropharynx is clear and moist and mucous membranes are normal. No oropharyngeal exudate, posterior oropharyngeal edema, posterior oropharyngeal erythema or tonsillar abscesses.  Neck: Normal range of motion. Neck supple.  Cardiovascular: Normal rate, regular rhythm and normal heart sounds.   Respiratory: Effort normal and breath sounds normal. No respiratory distress. She has no wheezes. She has no rales.  GI: Soft. She exhibits no distension and no mass. There is tenderness (diffuse all quadrants ). There is no rebound and no guarding.  Genitourinary:  Genitourinary Comments: External: no lesions or erythema Vagina: rugated, parous, scant thin white discharge Uterus: slightly enlarged, retroverted, non tender, no CMT Adnexae: no masses, no tenderness left, no tenderness right   Musculoskeletal: Normal range of motion.  Neurological: She is alert and oriented to person, place, and time.  Skin: Skin is warm and dry.  Psychiatric: She has a normal mood and affect.   [place] Ob Comp Less 14 Wks  Result Date: 07/20/2016 CLINICAL DATA:  Pregnant patient with abdominal pain. EXAM: OBSTETRIC <14 WK US AND TRANSVAGINAL OB US TECHNIQUE: Both transabdominal and transvaginal ultrasound examinations were performed for complete evaluation of the gestation as well as the  maternal uterus, adnexal regions, and pelvic cul-de-sac. Transvaginal technique was performed to assess early pregnancy. COMPARISON:  None. FINDINGS: Intrauterine gestational sac: Single Yolk sac:  Visualized. Embryo:  Visualized. Cardiac Activity: Visualized. Heart Rate: 136  bpm CRL:  8.7  mm   6 w   5 d                  [place] EDC: 03/10/2017 Subchorionic hemorrhage:  None  visualized. Maternal uterus/adnexae: Normal right and left ovaries. Retroverted uterus. No free fluid. IMPRESSION: Single live intrauterine gestation.  No subchorionic hemorrhage. Electronically Signed   By: Annia Belt M.D.   On: 07/20/2016 16:47   US Ob Transvaginal  Result Date: 07/20/2016 CLINICAL DATA:  Pregnant patient with abdominal pain. EXAM: OBSTETRIC <14 WK [place] AND TRANSVAGINAL OB US TECHNIQUE: Both transabdominal and transvaginal ultrasound examinations were performed for complete evaluation of the gestation as well as the maternal uterus, adnexal regions, and pelvic cul-de-sac. Transvaginal technique was performed to assess early pregnancy. COMPARISON:  None. FINDINGS: Intrauterine gestational sac: Single Yolk sac:  Visualized. Embryo:  Visualized. Cardiac Activity: Visualized. Heart Rate: 136  bpm CRL:  8.7  mm   6 w   5 d                  [place] EDC: 03/10/2017 Subchorionic hemorrhage:  None visualized. Maternal uterus/adnexae: Normal right and left ovaries. Retroverted uterus. No free fluid. IMPRESSION: Single live intrauterine gestation.  No subchorionic hemorrhage. Electronically Signed   By: Annia Belt M.D.   On: 07/20/2016 16:47   Results for orders placed or performed during the hospital encounter of 07/20/16 (from the past 24 hour(s))  Urinalysis, Routine w reflex microscopic     Status: None   Collection Time: 07/20/16  2:54 PM  Result Value Ref Range   Color, Urine YELLOW YELLOW   APPearance CLEAR CLEAR   Specific Gravity, Urine 1.021 1.005 - 1.030   pH 7.0 5.0 - 8.0   Glucose, UA NEGATIVE NEGATIVE mg/dL   Hgb urine dipstick NEGATIVE NEGATIVE   Bilirubin Urine NEGATIVE NEGATIVE   Ketones, ur NEGATIVE NEGATIVE mg/dL   Protein, ur NEGATIVE NEGATIVE mg/dL   Nitrite NEGATIVE NEGATIVE   Leukocytes, UA NEGATIVE NEGATIVE  Pregnancy, urine POC     Status: Abnormal   Collection Time: 07/20/16  3:03 PM  Result Value Ref Range   Preg Test, Ur POSITIVE (A) NEGATIVE  Influenza panel  by PCR (type A & B, H1N1)     Status: None   Collection Time: 07/20/16  3:44 PM  Result Value Ref Range   Influenza A By PCR NEGATIVE NEGATIVE   Influenza B By PCR NEGATIVE NEGATIVE  Wet prep, genital     Status: Abnormal   Collection Time: 07/20/16  3:45 PM  Result Value Ref Range   Yeast Wet Prep HPF POC NONE SEEN NONE SEEN   Trich, Wet Prep NONE SEEN NONE SEEN   Clue Cells Wet Prep HPF POC NONE SEEN NONE SEEN   WBC, Wet Prep HPF POC FEW (A) NONE SEEN   Sperm NONE SEEN   ABO/Rh     Status: None (Preliminary result)   Collection Time: 07/20/16  4:57 PM  Result Value Ref Range   ABO/RH(D) O POS   CBC     Status: Abnormal   Collection Time: 07/20/16  4:59 PM  Result Value Ref Range   WBC 6.7 4.0 - 10.5 K/uL   RBC 3.68 (L) 3.87 - 5.11 MIL/uL  Hemoglobin 12.0 12.0 - 15.0 g/dL   HCT 21.333.4 (L) 08.636.0 - 57.846.0 %   MCV 90.8 78.0 - 100.0 fL   MCH 32.6 26.0 - 34.0 pg   MCHC 35.9 30.0 - 36.0 g/dL   RDW 46.912.8 62.911.5 - 52.815.5 %   Platelets 172 150 - 400 K/uL    MAU Course  Procedures  MDM Labs and US ordered and reviewed. Live IUP @[redacted]w[redacted]d . Pain likely physiologic to pregnancy or MSK d/t coughing. Likely upper resp virus, supportive measures, OTCs for sx. Stable for discharge home.  Assessment and Plan   1. [redacted] weeks gestation of pregnancy   2. Abdominal pain affecting pregnancy   3. Normal intrauterine pregnancy on prenatal ultrasound in first trimester   4. Upper respiratory virus    Discharge home Rest Hydrate Mucinex OTC Zyrtec OTC Robitussin OTC Cool mist humidifier [facility]  Allergies as of 07/20/2016   No Known Allergies     Medication List    You have not been prescribed any medications.    Donette LarryMelanie Cozette Braggs, CNM 07/20/2016, 3:23 PM

## 2016-07-20 NOTE — Discharge Instructions (Signed)
Infeccin del tracto respiratorio superior, adultos (Upper Respiratory Infection, Adult) La mayora de las infecciones del tracto respiratorio superior estn causadas por un virus. Un infeccin del tracto respiratorio superior afecta la nariz, la garganta y las vas respiratorias superiores. El tipo ms comn de infeccin del tracto respiratorio superior es el resfro comn. CUIDADOS EN EL HOGAR  Tome los medicamentos solamente como se lo haya indicado el mdico.  A fin de aliviar el dolor de garganta, haga grgaras con solucin salina templada o consuma caramelos para la tos, como se lo haya indicado el mdico.  Use un humidificador de vapor clido o inhale el vapor de la ducha para aumentar la humedad del aire. Esto facilitar la respiracin.  Beba suficiente lquido para mantener el pis (orina) claro o de color amarillo plido.  Tome sopas y caldos transparentes.  Siga una dieta saludable.  Descanse todo lo que sea necesario.  Regrese al trabajo cuando la fiebre haya desaparecido o el mdico le diga que puede hacerlo.  Es posible que deba quedarse en su casa durante un tiempo prolongado, para no transmitir la infeccin a los dems.  Tambin puede usar un barbijo y lavarse las manos con frecuencia para evitar el contagio del virus.  Si tiene asma, use el inhalador con mayor frecuencia.  No consuma ningn producto que contenga tabaco, lo que incluye cigarrillos, tabaco de mascar o cigarrillos electrnicos. Si necesita ayuda para dejar de fumar, consulte al mdico. SOLICITE AYUDA SI:  Siente que empeora o que no mejora.  Los medicamentos no logran aliviar los sntomas.  Tiene escalofros.  La dificultad para respirar es peor.  Tiene mucosidad marrn o roja.  Tiene una secrecin amarilla o marrn de la nariz.  Le duele la cara, especialmente al inclinarse hacia adelante.  Tiene fiebre.  Tiene los ganglios del cuello hinchados.  Siente dolor al tragar.  Tiene zonas  blancas en la parte de atrs de la garganta. SOLICITE AYUDA DE INMEDIATO SI:  Los siguientes sntomas son muy intensos o constantes:  Dolor de cabeza.  Dolor de odos.  Dolor en la frente, detrs de los ojos y por encima de los pmulos (dolor sinusal).  Dolor en el pecho.  Tiene enfermedad pulmonar prolongada (crnica) y cualquiera de estos sntomas:  Sibilancias.  Tos prolongada.  Tos con sangre.  Cambio en la mucosidad habitual.  Presenta rigidez en el cuello.  Tiene cambios en:  La visin.  La audicin.  El pensamiento.  El estado de nimo. ASEGRESE DE QUE:  Comprende estas instrucciones.  Controlar su afeccin.  Recibir ayuda de inmediato si no mejora o si empeora. Esta informacin no tiene como fin reemplazar el consejo del mdico. Asegrese de hacerle al mdico cualquier pregunta que tenga. Document Released: 12/11/2010 Document Revised: 11/23/2014 Document Reviewed: 10/14/2013 Elsevier Interactive Patient Education  2017 Elsevier Inc.  

## 2016-07-20 NOTE — MAU Note (Addendum)
Pt presents to MAU with complaints of a fever for two days with lower abdominal cramping and a cough. Reports a yellow discharge

## 2016-07-23 LAB — CULTURE, GROUP A STREP (THRC)

## 2016-07-24 LAB — GC/CHLAMYDIA PROBE AMP (~~LOC~~) NOT AT ARMC
Chlamydia: NEGATIVE
Neisseria Gonorrhea: NEGATIVE

## 2016-08-15 ENCOUNTER — Other Ambulatory Visit (HOSPITAL_COMMUNITY): Payer: Self-pay | Admitting: Nurse Practitioner

## 2016-08-15 DIAGNOSIS — Z3682 Encounter for antenatal screening for nuchal translucency: Secondary | ICD-10-CM

## 2016-08-15 DIAGNOSIS — O09522 Supervision of elderly multigravida, second trimester: Secondary | ICD-10-CM

## 2016-08-15 DIAGNOSIS — Z3A12 12 weeks gestation of pregnancy: Secondary | ICD-10-CM

## 2016-08-15 LAB — OB RESULTS CONSOLE RPR: RPR: NONREACTIVE

## 2016-08-15 LAB — OB RESULTS CONSOLE ABO/RH: RH TYPE: POSITIVE

## 2016-08-15 LAB — OB RESULTS CONSOLE HEPATITIS B SURFACE ANTIGEN: HEP B S AG: NEGATIVE

## 2016-08-15 LAB — OB RESULTS CONSOLE ANTIBODY SCREEN: Antibody Screen: NEGATIVE

## 2016-08-15 LAB — OB RESULTS CONSOLE GC/CHLAMYDIA
Chlamydia: NEGATIVE
GC PROBE AMP, GENITAL: NEGATIVE

## 2016-08-15 LAB — OB RESULTS CONSOLE HIV ANTIBODY (ROUTINE TESTING): HIV: NONREACTIVE

## 2016-08-15 LAB — OB RESULTS CONSOLE RUBELLA ANTIBODY, IGM: Rubella: IMMUNE

## 2016-08-15 LAB — CYSTIC FIBROSIS DIAGNOSTIC STUDY: INTERPRETATION-CFDNA: NEGATIVE

## 2016-08-23 ENCOUNTER — Encounter (HOSPITAL_COMMUNITY): Payer: Self-pay | Admitting: Nurse Practitioner

## 2016-08-29 ENCOUNTER — Ambulatory Visit (HOSPITAL_COMMUNITY)
Admission: RE | Admit: 2016-08-29 | Discharge: 2016-08-29 | Disposition: A | Discharge status: Home / Self Care | Payer: Self-pay | Source: Ambulatory Visit | Attending: Nurse Practitioner | Admitting: Nurse Practitioner

## 2016-08-29 ENCOUNTER — Encounter (HOSPITAL_COMMUNITY): Payer: Self-pay

## 2016-08-29 VITALS — BP 118/65 | HR 63 | Wt 159.0 lb

## 2016-08-29 DIAGNOSIS — Z3A12 12 weeks gestation of pregnancy: Secondary | ICD-10-CM

## 2016-08-29 DIAGNOSIS — Z3682 Encounter for antenatal screening for nuchal translucency: Secondary | ICD-10-CM | POA: Insufficient documentation

## 2016-08-29 DIAGNOSIS — O0990 Supervision of high risk pregnancy, unspecified, unspecified trimester: Secondary | ICD-10-CM | POA: Insufficient documentation

## 2016-08-29 DIAGNOSIS — O283 Abnormal ultrasonic finding on antenatal screening of mother: Secondary | ICD-10-CM

## 2016-08-29 DIAGNOSIS — O09521 Supervision of elderly multigravida, first trimester: Secondary | ICD-10-CM

## 2016-08-29 DIAGNOSIS — O09522 Supervision of elderly multigravida, second trimester: Secondary | ICD-10-CM

## 2016-08-29 DIAGNOSIS — Z8279 Family history of other congenital malformations, deformations and chromosomal abnormalities: Secondary | ICD-10-CM | POA: Insufficient documentation

## 2016-08-29 NOTE — Progress Notes (Signed)
Appointment Date: 08/29/2016 DOB: 04-04-1976 Referring Provider: Trina Ao, NP Attending: Dr. Particia Nearing  Mrs. Lillis Nuttle was seen for genetic counseling because of a maternal age of 41 y.o. and the ultrasound finding of a diaphragmatic hernia.  Spanish interpreter, Mardene Celeste, provided Spanish/English translation.  In summary:  Discussed AMA and associated risk for fetal aneuploidy  Discussed options for screening  NIPS - Panorama drawn today  Reviewed ultrasound finding of diaphragmatic hernia  Follow up ultrasound ordered  Fetal echocardiogram ordered  Discussed diagnostic testing options  Amniocentesis - declined  Reviewed family history concerns  Daughter with situs inversus totalis  Discussed carrier screening options - declined  CF  SMA  Hemoglobinopathies  She was counseled regarding maternal age and the association with risk for chromosome conditions due to nondisjunction with aging of the ova.   We reviewed chromosomes, nondisjunction, and the associated 1 in 13 risk for fetal aneuploidy related to a maternal age of 41 y.o. at [redacted]w[redacted]d gestation.  She was counseled that the risk for aneuploidy decreases as gestational age increases, accounting for those pregnancies which spontaneously abort.  We specifically discussed Down syndrome (trisomy 28), trisomies 78 and 26, and sex chromosome aneuploidies (47,XXX and 47,XXY) including the common features and prognoses of each.   A nuchal translucency ultrasound was performed today.  The report will be documented separately.  A diaphragmatic hernia was suspected upon ultrasound today.  We discussed this finding in detail. She was counseled that the diaphragm is a thin curved muscle layer that separates the abdominal cavity from the chest cavity.  This muscle causes changes in air pressure through expanding and contracting in order aid in respiration.  At approximately 6-10 weeks of gestation, many components  converge and fuse in order to form the diaphragm. Incomplete fusion of one or more of the components of the diaphragm can lead to an opening between the abdominal and chest cavities, which may result in a herniation of organ(s) into the chest cavity.   Congenital diaphragmatic hernia [facility]) occurs in approximately 1 in 2,000-5,000 births and is left-sided in the majority of cases. Minor cases of diaphragmatic herniation may only cause digestive symptoms after birth.  More severe cases may cause parts of the intestines, liver or stomach to press against the lungs and/or heart and interfere with their function or development prenatally.  The main factors determining prognosis include the degree of underdevelopment of the lungs and the presence of other co-existing abnormalities, such as a heart defect. The presence of liver in the fetal chest has been associated with poor prognosis, as has the presence of polyhydramnios.    Congenital diaphragmatic hernia may be an isolated finding. However, approximately 30-50% of cases of  have associated abnormalities, including central nervous system or heart anomalies, and less commonly renal or spinal anomalies. Additionally, the presence of [facility] on prenatal ultrasound increases the chance for an underlying chromosome or genetic condition to be present.  We reviewed that prognosis is also affected by the underlying etiology.   We reviewed available screening options for chromosome conditions specifically, noninvasive prenatal screening (NIPS)/cell free DNA (cfDNA) screening.  She was counseled that screening tests are used to modify a patient's a priori risk for aneuploidy, typically based on age. This estimate provides a pregnancy specific risk assessment. We reviewed the benefits and limitations of this option. Specifically, we discussed the conditions for which the test screens, the detection rates, and false positive rates of each. She was also counseled regarding  diagnostic testing via amniocentesis. We reviewed the approximate 1 in 300-500 risk for complications from amniocentesis, including spontaneous pregnancy loss. We discussed the possible results that the tests might provide including: positive, negative, unanticipated, and no result. Finally, she was counseled regarding the cost of each option and potential out of pocket expenses.  After consideration of all the options, she elected to proceed with NIPS.  Those results will be available in 8-10 days.  She understands that screening tests cannot rule out all birth defects or genetic syndromes. The patient was advised of this limitation and states she still does not want diagnostic testing at this time.  .  We discussed options for the pregnancy including fetal echocardiogram, serial ultrasound and prenatal consultation with pediatric surgeon.  We also discussed the option of pregnancy termination.  She understands that the legal limit for termination of pregnancy in [place] is [redacted] weeks gestation.  Amanda Beard did not wish to discuss pregnancy termination.  She stated that she has medicaid covering the pregnancy, but only for 2 months.  For this reason, we will order a follow up ultrasound and fetal echocardiogram to be performed during this window of time.  Those appointments were scheduled today.  Amanda Beard was provided with written information regarding cystic fibrosis (CF), spinal muscular atrophy (SMA) and hemoglobinopathies including the carrier frequency, availability of carrier screening and prenatal diagnosis if indicated.  In addition, we discussed that CF and hemoglobinopathies are routinely screened for as part of the [place] newborn screening panel.  After further discussion, she declined screening for CF, SMA and hemoglobinopathies.  Both family histories were reviewed.  Amanda Beard reported that her first child, a daughter, was identified at birth as having situs  inversus totalis.  Her daughter is now 4014 and has no associated health concerns or learning differences.  We discussed that most often this is not thought to be an inherited condition, and this history is unlikely to have any impact upon her current pregnancy.   The remainder of the family histories were reviewed and found to be noncontributory for birth defects, intellectual disability, and known genetic conditions. Without further information regarding the provided family history, an accurate genetic risk cannot be calculated. Further genetic counseling is warranted if more information is obtained.  Amanda Beard denied exposure to environmental toxins or chemical agents. She denied the use of alcohol, tobacco or street drugs. She denied significant viral illnesses during the course of her pregnancy. Her medical and surgical histories were noncontributory.   I counseled Mrs. Olean Reeosales Beard regarding the above risks and available options.  The approximate face-to-face time with the genetic counselor was 50 minutes.  Mady Gemmaaragh Kelly Ranieri, MS,  Certified Genetic Counselor

## 2016-09-06 ENCOUNTER — Telehealth (HOSPITAL_COMMUNITY): Payer: Self-pay | Admitting: Genetics

## 2016-09-06 ENCOUNTER — Other Ambulatory Visit: Payer: Self-pay

## 2016-09-06 NOTE — Telephone Encounter (Signed)
Called Amanda Beard to review the results of her cell free DNA screening. The provided phone number went straight to voicemail.  I did not leave a message as it did not identify her on the voicemail.  Will try again later.  If the patient calls back please let her speak to any available genetic counselor to discuss her results.

## 2016-09-12 ENCOUNTER — Telehealth (HOSPITAL_COMMUNITY): Payer: Self-pay | Admitting: Genetics

## 2016-09-12 NOTE — Telephone Encounter (Signed)
Called patient with [employer].  There was no answer. They left a message for her to return the call and provided the main number at [facility].

## 2016-09-13 ENCOUNTER — Telehealth (HOSPITAL_COMMUNITY): Payer: Self-pay | Admitting: MS"

## 2016-09-13 NOTE — Telephone Encounter (Signed)
Called Amanda Beard to discuss her prenatal cell free DNA test results with [facility] [phone]#[id number].  Mrs. Amanda Beard had Panorama testing through [facility].  Testing was offered because of abnormal ultrasound finding and advanced maternal age.   The patient was identified by name and DOB.  We reviewed that these are within normal limits, showing a less than 1 in 10,000 risk for trisomies 21, 18 and 13, and monosomy X (Turner syndrome).  In addition, the risk for triploidy and sex chromosome trisomies (47,XXX and 47,XXY) was also low risk. We reviewed that this testing identifies > 99% of pregnancies with trisomy 4421, trisomy 2013, sex chromosome trisomies (47,XXX and 47,XXY), and triploidy. The detection rate for trisomy 18 is 96%.  The detection rate for monosomy X is ~92%.  The false positive rate is <0.1% for all conditions. Testing was also consistent with female fetal sex.  The patient did wish to know fetal sex.  She understands that this testing does not identify all genetic conditions.  All questions were answered to her satisfaction, she was encouraged to call with additional questions or concerns.  Amanda PlowmanKaren Elida Harbin, MS Certified Genetic Counselor 09/13/2016 12:44 PM

## 2016-09-28 ENCOUNTER — Other Ambulatory Visit (HOSPITAL_COMMUNITY): Payer: Self-pay | Admitting: *Deleted

## 2016-09-28 ENCOUNTER — Encounter (HOSPITAL_COMMUNITY): Payer: Self-pay

## 2016-09-28 ENCOUNTER — Ambulatory Visit (HOSPITAL_COMMUNITY)
Admission: RE | Admit: 2016-09-28 | Discharge: 2016-09-28 | Disposition: A | Discharge status: Home / Self Care | Payer: Self-pay | Source: Ambulatory Visit | Attending: Nurse Practitioner | Admitting: Nurse Practitioner

## 2016-09-28 DIAGNOSIS — O358XX0 Maternal care for other (suspected) fetal abnormality and damage, not applicable or unspecified: Secondary | ICD-10-CM

## 2016-09-28 DIAGNOSIS — O09522 Supervision of elderly multigravida, second trimester: Secondary | ICD-10-CM | POA: Insufficient documentation

## 2016-09-28 DIAGNOSIS — Z8279 Family history of other congenital malformations, deformations and chromosomal abnormalities: Secondary | ICD-10-CM | POA: Insufficient documentation

## 2016-09-28 DIAGNOSIS — Z3A16 16 weeks gestation of pregnancy: Secondary | ICD-10-CM | POA: Insufficient documentation

## 2016-09-28 DIAGNOSIS — Q790 Congenital diaphragmatic hernia: Secondary | ICD-10-CM | POA: Insufficient documentation

## 2016-09-28 DIAGNOSIS — O283 Abnormal ultrasonic finding on antenatal screening of mother: Secondary | ICD-10-CM

## 2016-09-28 DIAGNOSIS — O35FXX0 Maternal care for other (suspected) fetal abnormality and damage, fetal musculoskeletal anomalies of trunk, not applicable or unspecified: Secondary | ICD-10-CM

## 2016-10-12 ENCOUNTER — Encounter (HOSPITAL_COMMUNITY): Payer: Self-pay

## 2016-10-12 ENCOUNTER — Ambulatory Visit (HOSPITAL_COMMUNITY)
Admission: RE | Admit: 2016-10-12 | Discharge: 2016-10-12 | Disposition: A | Discharge status: Home / Self Care | Payer: Self-pay | Source: Ambulatory Visit | Attending: Nurse Practitioner | Admitting: Nurse Practitioner

## 2016-10-12 DIAGNOSIS — O358XX0 Maternal care for other (suspected) fetal abnormality and damage, not applicable or unspecified: Secondary | ICD-10-CM

## 2016-10-12 DIAGNOSIS — Z8279 Family history of other congenital malformations, deformations and chromosomal abnormalities: Secondary | ICD-10-CM | POA: Insufficient documentation

## 2016-10-12 DIAGNOSIS — Z3A18 18 weeks gestation of pregnancy: Secondary | ICD-10-CM | POA: Insufficient documentation

## 2016-10-12 DIAGNOSIS — O09522 Supervision of elderly multigravida, second trimester: Secondary | ICD-10-CM | POA: Insufficient documentation

## 2016-10-12 DIAGNOSIS — O35FXX0 Maternal care for other (suspected) fetal abnormality and damage, fetal musculoskeletal anomalies of trunk, not applicable or unspecified: Secondary | ICD-10-CM

## 2016-10-15 ENCOUNTER — Encounter: Payer: Self-pay | Admitting: *Deleted

## 2016-10-24 ENCOUNTER — Ambulatory Visit (INDEPENDENT_AMBULATORY_CARE_PROVIDER_SITE_OTHER): Payer: Self-pay | Admitting: Obstetrics and Gynecology

## 2016-10-24 ENCOUNTER — Encounter: Payer: Self-pay | Admitting: Obstetrics and Gynecology

## 2016-10-24 ENCOUNTER — Encounter: Payer: Self-pay | Admitting: *Deleted

## 2016-10-24 DIAGNOSIS — O09521 Supervision of elderly multigravida, first trimester: Secondary | ICD-10-CM

## 2016-10-24 DIAGNOSIS — O09292 Supervision of pregnancy with other poor reproductive or obstetric history, second trimester: Secondary | ICD-10-CM

## 2016-10-24 DIAGNOSIS — O09522 Supervision of elderly multigravida, second trimester: Secondary | ICD-10-CM

## 2016-10-24 DIAGNOSIS — O283 Abnormal ultrasonic finding on antenatal screening of mother: Secondary | ICD-10-CM

## 2016-10-24 LAB — POCT URINALYSIS DIP (DEVICE)
Bilirubin Urine: NEGATIVE
Glucose, UA: NEGATIVE mg/dL
HGB URINE DIPSTICK: NEGATIVE
Ketones, ur: NEGATIVE mg/dL
NITRITE: NEGATIVE
PH: 7.5 (ref 5.0–8.0)
PROTEIN: NEGATIVE mg/dL
Specific Gravity, Urine: 1.02 (ref 1.005–1.030)
Urobilinogen, UA: 0.2 mg/dL (ref 0.0–1.0)

## 2016-10-24 NOTE — Patient Instructions (Signed)

## 2016-10-24 NOTE — Progress Notes (Signed)
Breastfeeding education done 

## 2016-10-24 NOTE — Progress Notes (Signed)
   PRENATAL VISIT NOTE  Subjective:  Amanda Beard is a 41 y.o. [phone] at [redacted]w[redacted]d being seen today for ongoing prenatal care transferring from [facility]. She is currently monitored for the following issues for this high-risk pregnancy and has [redacted] weeks gestation of pregnancy; Abnormal fetal ultrasound; and Advanced maternal age in multigravida, first trimester on her problem list.  Patient reports no complaints.  Contractions: Not present. Vag. Bleeding: None.  Movement: Present. Denies leaking of fluid.   The following portions of the patient's history were reviewed and updated as appropriate: allergies, current medications, past family history, past medical history, past social history, past surgical history and problem list. Problem list updated.  Objective:   Vitals:   10/24/16 0814  BP: 112/67  Pulse: 79  Weight: 171 lb 4.8 oz (77.7 kg)    Fetal Status: Fetal Heart Rate (bpm): 143   Movement: Present     General:  Alert, oriented and cooperative. Patient is in no acute distress.  Skin: Skin is warm and dry. No rash noted.   Cardiovascular: Normal heart rate noted  Respiratory: Normal respiratory effort, no problems with respiration noted  Abdomen: Soft, gravid, appropriate for gestational age. Pain/Pressure: Absent     Pelvic:  Cervical exam deferred        Extremities: Normal range of motion.  Edema: None  Mental Status: Normal mood and affect. Normal behavior. Normal judgment and thought content.   Assessment and Plan:  Pregnancy: [id number] at [redacted]w[redacted]d Advanced maternal age in multigravida, first trimester  Abnormal fetal ultrasound  H/O fetal anomaly in prior pregnancy, currently pregnant, second trimester  Reviewed PN records, labs, [place]  Serial MFM scans scheduled  Preterm labor symptoms and general obstetric precautions including but not limited to vaginal bleeding, contractions, leaking of fluid and fetal movement were reviewed in detail with the patient. Please  refer to After Visit Summary for other counseling recommendations.  Return in about 1 month (around 11/23/2016).   Danae Orleans, CNM

## 2016-10-26 ENCOUNTER — Ambulatory Visit (HOSPITAL_COMMUNITY): Admission: RE | Admit: 2016-10-26 | Payer: Self-pay | Source: Ambulatory Visit

## 2016-10-26 ENCOUNTER — Other Ambulatory Visit (HOSPITAL_COMMUNITY): Payer: Self-pay

## 2016-11-04 ENCOUNTER — Encounter: Payer: Self-pay | Admitting: Family Medicine

## 2016-11-04 DIAGNOSIS — O09529 Supervision of elderly multigravida, unspecified trimester: Secondary | ICD-10-CM | POA: Insufficient documentation

## 2016-11-09 ENCOUNTER — Encounter (HOSPITAL_COMMUNITY): Payer: Self-pay

## 2016-11-09 ENCOUNTER — Ambulatory Visit (HOSPITAL_COMMUNITY)
Admission: RE | Admit: 2016-11-09 | Discharge: 2016-11-09 | Disposition: A | Discharge status: Home / Self Care | Payer: Self-pay | Source: Ambulatory Visit | Attending: Nurse Practitioner | Admitting: Nurse Practitioner

## 2016-11-09 DIAGNOSIS — Z3A22 22 weeks gestation of pregnancy: Secondary | ICD-10-CM | POA: Insufficient documentation

## 2016-11-09 DIAGNOSIS — O35FXX0 Maternal care for other (suspected) fetal abnormality and damage, fetal musculoskeletal anomalies of trunk, not applicable or unspecified: Secondary | ICD-10-CM

## 2016-11-09 DIAGNOSIS — Z8279 Family history of other congenital malformations, deformations and chromosomal abnormalities: Secondary | ICD-10-CM | POA: Insufficient documentation

## 2016-11-09 DIAGNOSIS — O358XX0 Maternal care for other (suspected) fetal abnormality and damage, not applicable or unspecified: Secondary | ICD-10-CM | POA: Insufficient documentation

## 2016-11-09 DIAGNOSIS — O09522 Supervision of elderly multigravida, second trimester: Secondary | ICD-10-CM | POA: Insufficient documentation

## 2016-11-23 ENCOUNTER — Other Ambulatory Visit (HOSPITAL_COMMUNITY): Payer: Self-pay

## 2016-11-23 ENCOUNTER — Encounter (HOSPITAL_COMMUNITY): Payer: Self-pay

## 2016-11-23 ENCOUNTER — Ambulatory Visit (HOSPITAL_COMMUNITY)
Admission: RE | Admit: 2016-11-23 | Discharge: 2016-11-23 | Disposition: A | Discharge status: Home / Self Care | Payer: Self-pay | Source: Ambulatory Visit | Attending: Nurse Practitioner | Admitting: Nurse Practitioner

## 2016-11-23 VITALS — BP 114/64 | HR 68 | Wt 178.8 lb

## 2016-11-23 DIAGNOSIS — O283 Abnormal ultrasonic finding on antenatal screening of mother: Secondary | ICD-10-CM

## 2016-11-23 DIAGNOSIS — Z3A24 24 weeks gestation of pregnancy: Secondary | ICD-10-CM | POA: Insufficient documentation

## 2016-11-23 DIAGNOSIS — O358XX0 Maternal care for other (suspected) fetal abnormality and damage, not applicable or unspecified: Secondary | ICD-10-CM | POA: Insufficient documentation

## 2016-11-23 DIAGNOSIS — O09523 Supervision of elderly multigravida, third trimester: Secondary | ICD-10-CM

## 2016-11-23 HISTORY — DX: Abnormal ultrasonic finding on antenatal screening of mother: O28.3

## 2016-11-28 ENCOUNTER — Encounter: Payer: Self-pay | Admitting: Obstetrics and Gynecology

## 2016-12-03 ENCOUNTER — Ambulatory Visit (INDEPENDENT_AMBULATORY_CARE_PROVIDER_SITE_OTHER): Payer: Self-pay | Admitting: Family Medicine

## 2016-12-03 VITALS — BP 104/68 | HR 82 | Wt 181.2 lb

## 2016-12-03 DIAGNOSIS — O3692X0 Maternal care for fetal problem, unspecified, second trimester, not applicable or unspecified: Secondary | ICD-10-CM

## 2016-12-03 DIAGNOSIS — K429 Umbilical hernia without obstruction or gangrene: Secondary | ICD-10-CM

## 2016-12-03 DIAGNOSIS — O0990 Supervision of high risk pregnancy, unspecified, unspecified trimester: Secondary | ICD-10-CM

## 2016-12-03 DIAGNOSIS — O09523 Supervision of elderly multigravida, third trimester: Secondary | ICD-10-CM

## 2016-12-03 DIAGNOSIS — O0993 Supervision of high risk pregnancy, unspecified, third trimester: Secondary | ICD-10-CM

## 2016-12-03 MED ORDER — PRENATAL VITAMIN 27-0.8 MG PO TABS: 1.0000 | ORAL_TABLET | Freq: Every day | ORAL | 3 refills | Status: DC

## 2016-12-03 NOTE — Progress Notes (Signed)
   PRENATAL VISIT NOTE  Subjective:  Amanda Beard is a 41 y.o. [phone] at 8410w1d being seen today for ongoing prenatal care.  She is currently monitored for the following issues for this high-risk pregnancy and has Abnormal fetal ultrasound; Supervision of high risk pregnancy, antepartum; Fetal problem in antepartum period in second trimester; H/O fetal anomaly in prior pregnancy, currently pregnant, second trimester; and AMA (advanced maternal age) multigravida 35+ on her problem list.  Patient reports umbilical pain, worsening over 3 weeks. No radiation. Her belly button is sticking out as well. Worse with standing and walking aroun, worse in afternoons. Improved with laying down..  Contractions: Not present. Vag. Bleeding: None.  Movement: Present. Denies leaking of fluid.   The following portions of the patient's history were reviewed and updated as appropriate: allergies, current medications, past family history, past medical history, past social history, past surgical history and problem list. Problem list updated.  Objective:   Vitals:   12/03/16 1416  BP: 104/68  Pulse: 82  Weight: 181 lb 3.2 oz (82.2 kg)    Fetal Status: Fetal Heart Rate (bpm): 145 Fundal Height: 28 cm Movement: Present     General:  Alert, oriented and cooperative. Patient is in no acute distress.  Skin: Skin is warm and dry. No rash noted.   Cardiovascular: Normal heart rate noted  Respiratory: Normal respiratory effort, no problems with respiration noted  Abdomen: Soft, gravid, appropriate for gestational age. Pain/Pressure: Present 1-2cm umbilical hernia.    Pelvic:  Cervical exam deferred        Extremities: Normal range of motion.  Edema: None  Mental Status: Normal mood and affect. Normal behavior. Normal judgment and thought content.   Assessment and Plan:  Pregnancy: [id number] at 3310w1d  1. Supervision of high risk pregnancy, antepartum FHT and FH normal. Return next week for 28 week  labs.  2. Fetal problem in second trimester, single or unspecified fetus Congentical Diaphragmatic Hernia. [facility] on Friday  3. Elderly multigravida in third trimester NST at 36 weeks.  4. Umbilical hernia without obstruction and without gangrene No evidence of entrapment.   Preterm labor symptoms and general obstetric precautions including but not limited to vaginal bleeding, contractions, leaking of fluid and fetal movement were reviewed in detail with the patient. Please refer to After Visit Summary for other counseling recommendations.  Return in about 4 weeks (around 12/31/2016).   Levie HeritageStinson, Jacob J, DO

## 2016-12-03 NOTE — Progress Notes (Signed)
Video Interpreter # [phone]

## 2016-12-04 ENCOUNTER — Other Ambulatory Visit: Payer: Self-pay

## 2016-12-04 DIAGNOSIS — O0990 Supervision of high risk pregnancy, unspecified, unspecified trimester: Secondary | ICD-10-CM

## 2016-12-05 LAB — GLUCOSE TOLERANCE, 2 HOURS W/ 1HR
GLUCOSE, FASTING: 83 mg/dL (ref 65–91)
Glucose, 1 hour: 151 mg/dL (ref 65–179)
Glucose, 2 hour: 100 mg/dL (ref 65–152)

## 2016-12-05 LAB — CBC
HEMATOCRIT: 34.3 % (ref 34.0–46.6)
Hemoglobin: 11.8 g/dL (ref 11.1–15.9)
MCH: 33 pg (ref 26.6–33.0)
MCHC: 34.4 g/dL (ref 31.5–35.7)
MCV: 96 fL (ref 79–97)
PLATELETS: 203 10*3/uL (ref 150–379)
RBC: 3.58 x10E6/uL — ABNORMAL LOW (ref 3.77–5.28)
RDW: 14 % (ref 12.3–15.4)
WBC: 5.9 10*3/uL (ref 3.4–10.8)

## 2016-12-05 LAB — HIV ANTIBODY (ROUTINE TESTING W REFLEX): HIV Screen 4th Generation wRfx: NONREACTIVE

## 2016-12-05 LAB — RPR: RPR Ser Ql: NONREACTIVE

## 2016-12-07 ENCOUNTER — Encounter (HOSPITAL_COMMUNITY): Payer: Self-pay

## 2016-12-07 ENCOUNTER — Ambulatory Visit (HOSPITAL_COMMUNITY)
Admission: RE | Admit: 2016-12-07 | Discharge: 2016-12-07 | Disposition: A | Discharge status: Home / Self Care | Payer: Self-pay | Source: Ambulatory Visit | Attending: Nurse Practitioner | Admitting: Nurse Practitioner

## 2016-12-07 DIAGNOSIS — Z3A26 26 weeks gestation of pregnancy: Secondary | ICD-10-CM | POA: Insufficient documentation

## 2016-12-07 DIAGNOSIS — O35FXX0 Maternal care for other (suspected) fetal abnormality and damage, fetal musculoskeletal anomalies of trunk, not applicable or unspecified: Secondary | ICD-10-CM

## 2016-12-07 DIAGNOSIS — O358XX0 Maternal care for other (suspected) fetal abnormality and damage, not applicable or unspecified: Secondary | ICD-10-CM | POA: Insufficient documentation

## 2016-12-07 DIAGNOSIS — O402XX0 Polyhydramnios, second trimester, not applicable or unspecified: Secondary | ICD-10-CM | POA: Insufficient documentation

## 2016-12-21 ENCOUNTER — Other Ambulatory Visit (HOSPITAL_COMMUNITY): Payer: Self-pay | Admitting: Obstetrics and Gynecology

## 2016-12-21 ENCOUNTER — Encounter (HOSPITAL_COMMUNITY): Payer: Self-pay

## 2016-12-21 ENCOUNTER — Ambulatory Visit (HOSPITAL_COMMUNITY)
Admission: RE | Admit: 2016-12-21 | Discharge: 2016-12-21 | Disposition: A | Discharge status: Home / Self Care | Payer: Self-pay | Source: Ambulatory Visit | Attending: Obstetrics & Gynecology | Admitting: Obstetrics & Gynecology

## 2016-12-21 ENCOUNTER — Other Ambulatory Visit (HOSPITAL_COMMUNITY): Payer: Self-pay | Admitting: *Deleted

## 2016-12-21 DIAGNOSIS — Z3A28 28 weeks gestation of pregnancy: Secondary | ICD-10-CM | POA: Insufficient documentation

## 2016-12-21 DIAGNOSIS — Q790 Congenital diaphragmatic hernia: Secondary | ICD-10-CM

## 2016-12-21 DIAGNOSIS — O358XX0 Maternal care for other (suspected) fetal abnormality and damage, not applicable or unspecified: Secondary | ICD-10-CM

## 2016-12-21 DIAGNOSIS — O35FXX0 Maternal care for other (suspected) fetal abnormality and damage, fetal musculoskeletal anomalies of trunk, not applicable or unspecified: Secondary | ICD-10-CM

## 2016-12-21 DIAGNOSIS — Z8279 Family history of other congenital malformations, deformations and chromosomal abnormalities: Secondary | ICD-10-CM

## 2016-12-21 DIAGNOSIS — O403XX0 Polyhydramnios, third trimester, not applicable or unspecified: Secondary | ICD-10-CM | POA: Insufficient documentation

## 2016-12-21 DIAGNOSIS — O9989 Other specified diseases and conditions complicating pregnancy, childbirth and the puerperium: Secondary | ICD-10-CM | POA: Insufficient documentation

## 2016-12-21 DIAGNOSIS — O09522 Supervision of elderly multigravida, second trimester: Secondary | ICD-10-CM | POA: Insufficient documentation

## 2016-12-24 ENCOUNTER — Encounter (HOSPITAL_COMMUNITY): Payer: Self-pay | Admitting: *Deleted

## 2016-12-24 ENCOUNTER — Inpatient Hospital Stay (HOSPITAL_COMMUNITY)
Admission: AD | Admit: 2016-12-24 | Discharge: 2016-12-27 | DRG: 781 | Disposition: A | Discharge status: Home / Self Care | Payer: Medicaid Other | Source: Ambulatory Visit | Attending: Obstetrics and Gynecology | Admitting: Obstetrics and Gynecology

## 2016-12-24 DIAGNOSIS — Z87891 Personal history of nicotine dependence: Secondary | ICD-10-CM | POA: Diagnosis not present

## 2016-12-24 DIAGNOSIS — O4703 False labor before 37 completed weeks of gestation, third trimester: Secondary | ICD-10-CM | POA: Diagnosis present

## 2016-12-24 DIAGNOSIS — M549 Dorsalgia, unspecified: Secondary | ICD-10-CM | POA: Diagnosis present

## 2016-12-24 DIAGNOSIS — N10 Acute tubulo-interstitial nephritis: Secondary | ICD-10-CM

## 2016-12-24 DIAGNOSIS — Z3A29 29 weeks gestation of pregnancy: Secondary | ICD-10-CM

## 2016-12-24 DIAGNOSIS — O2303 Infections of kidney in pregnancy, third trimester: Secondary | ICD-10-CM | POA: Diagnosis present

## 2016-12-24 DIAGNOSIS — O09523 Supervision of elderly multigravida, third trimester: Secondary | ICD-10-CM | POA: Diagnosis not present

## 2016-12-24 LAB — URINALYSIS, ROUTINE W REFLEX MICROSCOPIC
Bilirubin Urine: NEGATIVE
Glucose, UA: NEGATIVE mg/dL
HGB URINE DIPSTICK: NEGATIVE
KETONES UR: NEGATIVE mg/dL
NITRITE: NEGATIVE
PH: 6 (ref 5.0–8.0)
Protein, ur: NEGATIVE mg/dL
SPECIFIC GRAVITY, URINE: 1.021 (ref 1.005–1.030)

## 2016-12-24 LAB — CBC WITH DIFFERENTIAL/PLATELET
Basophils Absolute: 0 10*3/uL (ref 0.0–0.1)
Basophils Relative: 0 %
EOS ABS: 0.1 10*3/uL (ref 0.0–0.7)
Eosinophils Relative: 2 %
HCT: 33.3 % — ABNORMAL LOW (ref 36.0–46.0)
Hemoglobin: 11.9 g/dL — ABNORMAL LOW (ref 12.0–15.0)
LYMPHS ABS: 1.6 10*3/uL (ref 0.7–4.0)
Lymphocytes Relative: 26 %
MCH: 33.2 pg (ref 26.0–34.0)
MCHC: 35.7 g/dL (ref 30.0–36.0)
MCV: 93 fL (ref 78.0–100.0)
MONOS PCT: 2 %
Monocytes Absolute: 0.1 10*3/uL (ref 0.1–1.0)
Neutro Abs: 4.3 10*3/uL (ref 1.7–7.7)
Neutrophils Relative %: 70 %
PLATELETS: 173 10*3/uL (ref 150–400)
RBC: 3.58 MIL/uL — ABNORMAL LOW (ref 3.87–5.11)
RDW: 12.7 % (ref 11.5–15.5)
WBC: 6.1 10*3/uL (ref 4.0–10.5)

## 2016-12-24 LAB — TYPE AND SCREEN
ABO/RH(D): O POS
Antibody Screen: NEGATIVE

## 2016-12-24 LAB — FETAL FIBRONECTIN: FETAL FIBRONECTIN: NEGATIVE

## 2016-12-24 MED ORDER — DEXTROSE 5 % IV SOLN
2.0000 g | INTRAVENOUS | Status: DC
  Administered 2016-12-24 – 2016-12-26 (×3): 2 g via INTRAVENOUS
  Filled 2016-12-24 (×4): qty 2

## 2016-12-24 MED ORDER — CALCIUM CARBONATE ANTACID 500 MG PO CHEW: 2.0000 | CHEWABLE_TABLET | ORAL | Status: DC | PRN

## 2016-12-24 MED ORDER — ZOLPIDEM TARTRATE 5 MG PO TABS
5.0000 mg | ORAL_TABLET | Freq: Every evening | ORAL | Status: DC | PRN
  Administered 2016-12-26: 5 mg via ORAL
  Filled 2016-12-24: qty 1

## 2016-12-24 MED ORDER — ACETAMINOPHEN 325 MG PO TABS: 650.0000 mg | ORAL_TABLET | ORAL | Status: DC | PRN

## 2016-12-24 MED ORDER — OXYCODONE-ACETAMINOPHEN 5-325 MG PO TABS
1.0000 | ORAL_TABLET | Freq: Four times a day (QID) | ORAL | Status: DC | PRN
  Administered 2016-12-24 – 2016-12-25 (×3): 1 via ORAL
  Filled 2016-12-24 (×3): qty 1

## 2016-12-24 MED ORDER — PRENATAL MULTIVITAMIN CH: 1.0000 | ORAL_TABLET | Freq: Every day | ORAL | Status: DC

## 2016-12-24 MED ORDER — DOCUSATE SODIUM 100 MG PO CAPS: 100.0000 mg | ORAL_CAPSULE | Freq: Every day | ORAL | Status: DC | PRN

## 2016-12-24 MED ORDER — SODIUM CHLORIDE 0.9 % IV SOLN
INTRAVENOUS | Status: DC
  Administered 2016-12-24 – 2016-12-25 (×4): via INTRAVENOUS

## 2016-12-24 MED ORDER — ZOLPIDEM TARTRATE 5 MG PO TABS: 5.0000 mg | ORAL_TABLET | Freq: Every evening | ORAL | Status: DC | PRN

## 2016-12-24 MED ORDER — ACETAMINOPHEN 325 MG PO TABS
650.0000 mg | ORAL_TABLET | ORAL | Status: DC | PRN
  Administered 2016-12-25 – 2016-12-26 (×2): 650 mg via ORAL
  Filled 2016-12-24 (×2): qty 2

## 2016-12-24 MED ORDER — ONDANSETRON HCL 4 MG/2ML IJ SOLN: 4.0000 mg | Freq: Three times a day (TID) | INTRAMUSCULAR | Status: DC | PRN

## 2016-12-24 MED ORDER — ONDANSETRON HCL 4 MG/2ML IJ SOLN
4.0000 mg | Freq: Once | INTRAMUSCULAR | Status: AC
Start: 2016-12-24 — End: 2016-12-24
  Administered 2016-12-24: 4 mg via INTRAVENOUS
  Filled 2016-12-24: qty 2

## 2016-12-24 MED ORDER — NIFEDIPINE 10 MG PO CAPS
20.0000 mg | ORAL_CAPSULE | Freq: Once | ORAL | Status: AC
  Administered 2016-12-24: 20 mg via ORAL
  Filled 2016-12-24: qty 2

## 2016-12-24 MED ORDER — DOCUSATE SODIUM 100 MG PO CAPS: 100.0000 mg | ORAL_CAPSULE | Freq: Every day | ORAL | Status: DC

## 2016-12-24 MED ORDER — OXYCODONE-ACETAMINOPHEN 5-325 MG PO TABS
1.0000 | ORAL_TABLET | Freq: Once | ORAL | Status: AC
  Administered 2016-12-24: 1 via ORAL
  Filled 2016-12-24: qty 1

## 2016-12-24 MED ORDER — LACTATED RINGERS IV SOLN: INTRAVENOUS | Status: DC

## 2016-12-24 MED ORDER — SODIUM CHLORIDE 0.9 % IV BOLUS (SEPSIS)
1000.0000 mL | Freq: Once | INTRAVENOUS | Status: AC
  Administered 2016-12-24: 1000 mL via INTRAVENOUS

## 2016-12-24 MED ORDER — LACTATED RINGERS IV SOLN
INTRAVENOUS | Status: DC
Start: 2016-12-24 — End: 2016-12-24
  Administered 2016-12-24: 12:00:00 via INTRAVENOUS

## 2016-12-24 NOTE — MAU Note (Signed)
Pt presents to MAU with complaints of lower back and abdominal pain since Thursday night. Denies any VB or abnormal discharge.

## 2016-12-24 NOTE — MAU Note (Addendum)
Pt reports back pain since Friday that has become increasingly worse as the weekend progressed. Pain 8/10 in R back. Pt took tylenol last night with minimal relief and did not sleep well. Pt has felt "hot and cold" But has not taken her temp at home. + FM. Denies bleeding or leaking of fluid. Pt also was vomiting this weekend. Vomited once this morning. Denies n/v with pregnancy up until this weekend

## 2016-12-24 NOTE — MAU Provider Note (Signed)
History     CSN: [id number]  Arrival date and time: 12/24/16 1100   First Provider Initiated Contact with Patient 12/24/16 1156      Chief Complaint  Patient presents with  . Emesis  . Abdominal Pain   HPI  Ms. Amanda Beard is a 41 y.o. female [id number] @ 1556w1d here in MAU with right upper back pain and N/V. The symptoms started on Thursday. She feels the symptoms have gotten worse and more painful since then. The pain is there all the time no matter which position she is in. She feels she has to move a lot to change positions in order to relieve the pain. She denies vaginal bleeding. + fetal movement.  Patient rates her right upper back pain 8/10.  OB History    Gravida Para Term Preterm AB Living   5 3 3   1 3    SAB TAB Ectopic Multiple Live Births       1   3      Past Medical History:  Diagnosis Date  . 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  Normal fetal ECHO and NIPT   . Depression    postpartum 2003  . Headache   . Medical history non-contributory     Past Surgical History:  Procedure Laterality Date  . ECTOPIC PREGNANCY SURGERY      Family History  Problem Relation Age of Onset  . Diabetes Mother   . Diabetes Maternal Uncle   . Diabetes Maternal Grandmother   . COPD Paternal Grandfather        brain    Social History  Substance Use Topics  . Smoking status: Former Smoker    Quit date: 07/23/2000  . Smokeless tobacco: Never Used  . Alcohol use No    Allergies: No Known Allergies  Prescriptions Prior to Admission  Medication Sig Dispense Refill Last Dose  . acetaminophen (TYLENOL) 500 MG chewable tablet Chew 500 mg by mouth every 6 (six) hours as needed for pain.   12/23/2016  . Prenatal Vit-Fe Fumarate-FA (PRENATAL VITAMIN) 27-0.8 MG TABS Take 1 tablet by mouth daily. 90 tablet 3 12/23/2016   Results for orders placed or performed during the hospital encounter of 12/24/16 (from the past 48  hour(s))  Urinalysis, Routine w reflex microscopic     Status: Abnormal   Collection Time: 12/24/16 11:20 AM  Result Value Ref Range   Color, Urine YELLOW YELLOW   APPearance CLEAR CLEAR   Specific Gravity, Urine 1.021 1.005 - 1.030   pH 6.0 5.0 - 8.0   Glucose, UA NEGATIVE NEGATIVE mg/dL   Hgb urine dipstick NEGATIVE NEGATIVE   Bilirubin Urine NEGATIVE NEGATIVE   Ketones, ur NEGATIVE NEGATIVE mg/dL   Protein, ur NEGATIVE NEGATIVE mg/dL   Nitrite NEGATIVE NEGATIVE   Leukocytes, UA SMALL (A) NEGATIVE   RBC / HPF 0-5 0 - 5 RBC/hpf   WBC, UA 6-30 0 - 5 WBC/hpf   Bacteria, UA RARE (A) NONE SEEN   Squamous Epithelial / LPF 0-5 (A) NONE SEEN   Mucous PRESENT   CBC with Differential     Status: Abnormal   Collection Time: 12/24/16 12:03 PM  Result Value Ref Range   WBC 6.1 4.0 - 10.5 K/uL   RBC 3.58 (L) 3.87 - 5.11 MIL/uL   Hemoglobin 11.9 (L) 12.0 - 15.0 g/dL   HCT 43.333.3 (L) 29.536.0 - 18.846.0 %   MCV 93.0 78.0 - 100.0 fL  MCH 33.2 26.0 - 34.0 pg   MCHC 35.7 30.0 - 36.0 g/dL   RDW 16.1 09.6 - 04.5 %   Platelets 173 150 - 400 K/uL   Neutrophils Relative % 70 %   Neutro Abs 4.3 1.7 - 7.7 K/uL   Lymphocytes Relative 26 %   Lymphs Abs 1.6 0.7 - 4.0 K/uL   Monocytes Relative 2 %   Monocytes Absolute 0.1 0.1 - 1.0 K/uL   Eosinophils Relative 2 %   Eosinophils Absolute 0.1 0.0 - 0.7 K/uL   Basophils Relative 0 %   Basophils Absolute 0.0 0.0 - 0.1 K/uL  Fetal fibronectin     Status: None   Collection Time: 12/24/16 12:03 PM  Result Value Ref Range   Fetal Fibronectin NEGATIVE NEGATIVE   Review of Systems  Constitutional: Positive for fever (Didnt check temperature, however feels it was high).  Genitourinary: Positive for frequency and urgency. Negative for dysuria.   Physical Exam   Blood pressure 113/75, pulse 72, temperature 98.5 F (36.9 C), temperature source Oral, resp. rate 18, height 5\' 4"  (1.626 m), weight 185 lb (83.9 kg), last menstrual period 05/08/2016, SpO2 99  %.  Physical Exam  Constitutional: She is oriented to person, place, and time. She appears well-developed and well-nourished. No distress.  HENT:  Head: Normocephalic.  Eyes: Pupils are equal, round, and reactive to light.  Neck: Neck supple.  Respiratory: Effort normal.  GI: Soft.  Genitourinary:  Genitourinary Comments: Dilation: 1 Effacement (%): Thick Cervical Position: Posterior Exam by:: J Marleny Faller NP   Musculoskeletal: Normal range of motion.  Neurological: She is alert and oriented to person, place, and time.  Skin: Skin is warm. She is not diaphoretic.  Psychiatric: Her behavior is normal.   Fetal Tracing: Baseline: 130 bpm Variability: moderate  Accelerations: 15x15 Decelerations: quick variables  Toco: Q5-7 irregular pattern, none following procardia dose.   MAU Course  Procedures  none  MDM  LR bolus X1 Urine culture pending Percocet 1 tab Procardia 20 mg X 1 CBC with diff Fetal fibronectin negative Discussed patient with Dr. Vergie Living will admit.   Assessment and Plan   A:  1. Acute pyelonephritis     P:  Admit to Ante Urine culture pending Rocephin  Lr bolus then LR @ 125 ml hour.    Venia Carbon I, NP 12/24/2016 1:34 PM

## 2016-12-24 NOTE — Progress Notes (Signed)
Pt offered interpreter, offer accepted.  Pt informed interpreter to called.

## 2016-12-25 DIAGNOSIS — O2303 Infections of kidney in pregnancy, third trimester: Principal | ICD-10-CM

## 2016-12-25 DIAGNOSIS — Z3A29 29 weeks gestation of pregnancy: Secondary | ICD-10-CM

## 2016-12-25 LAB — CBC WITH DIFFERENTIAL/PLATELET
BASOS ABS: 0 10*3/uL (ref 0.0–0.1)
BASOS PCT: 0 %
Eosinophils Absolute: 0.2 10*3/uL (ref 0.0–0.7)
Eosinophils Relative: 4 %
HEMATOCRIT: 31.5 % — AB (ref 36.0–46.0)
HEMOGLOBIN: 10.8 g/dL — AB (ref 12.0–15.0)
LYMPHS PCT: 37 %
Lymphs Abs: 2 10*3/uL (ref 0.7–4.0)
MCH: 32.4 pg (ref 26.0–34.0)
MCHC: 34.3 g/dL (ref 30.0–36.0)
MCV: 94.6 fL (ref 78.0–100.0)
MONO ABS: 0.3 10*3/uL (ref 0.1–1.0)
MONOS PCT: 5 %
NEUTROS ABS: 2.9 10*3/uL (ref 1.7–7.7)
NEUTROS PCT: 54 %
Platelets: 148 10*3/uL — ABNORMAL LOW (ref 150–400)
RBC: 3.33 MIL/uL — ABNORMAL LOW (ref 3.87–5.11)
RDW: 13 % (ref 11.5–15.5)
WBC: 5.3 10*3/uL (ref 4.0–10.5)

## 2016-12-25 LAB — BASIC METABOLIC PANEL
ANION GAP: 6 (ref 5–15)
BUN: 10 mg/dL (ref 6–20)
CHLORIDE: 107 mmol/L (ref 101–111)
CO2: 22 mmol/L (ref 22–32)
Calcium: 8.1 mg/dL — ABNORMAL LOW (ref 8.9–10.3)
Creatinine, Ser: 0.46 mg/dL (ref 0.44–1.00)
GFR calc non Af Amer: >60 mL/min (ref >60)
GLUCOSE: 88 mg/dL (ref 65–99)
Potassium: 3.8 mmol/L (ref 3.5–5.1)
Sodium: 135 mmol/L (ref 135–145)

## 2016-12-25 MED ORDER — NIFEDIPINE 10 MG PO CAPS
10.0000 mg | ORAL_CAPSULE | Freq: Three times a day (TID) | ORAL | Status: DC
  Administered 2016-12-25: 10 mg via ORAL
  Filled 2016-12-25 (×2): qty 1

## 2016-12-25 MED ORDER — NIFEDIPINE ER OSMOTIC RELEASE 30 MG PO TB24
30.0000 mg | ORAL_TABLET | Freq: Two times a day (BID) | ORAL | Status: DC
Start: 2016-12-25 — End: 2016-12-27
  Administered 2016-12-25 – 2016-12-27 (×4): 30 mg via ORAL
  Filled 2016-12-25 (×4): qty 1

## 2016-12-25 MED ORDER — NIFEDIPINE 10 MG PO CAPS
10.0000 mg | ORAL_CAPSULE | Freq: Once | ORAL | Status: AC
Start: 2016-12-25 — End: 2016-12-25
  Administered 2016-12-25: 10 mg via ORAL

## 2016-12-25 NOTE — Progress Notes (Signed)
Patient Name: Amanda SealsMaria B Rosales Alonso, female   DOB: 02-Aug-1975, 41 y.o.  MRN: [id number]  Patient seen having contractions, feeling some pain in back. Will give procardia, both IM and XR together. Nurse checked cervix - unchanged from previous exam. If this does not decrease contractions, may need to go on magnesium and [facility]  Levie HeritageStinson, Desha Bitner J, DO 12/25/2016 8:24 PM

## 2016-12-26 ENCOUNTER — Encounter (HOSPITAL_COMMUNITY): Payer: Self-pay | Admitting: *Deleted

## 2016-12-26 LAB — CULTURE, OB URINE
Culture: >=100000 — AB
SPECIAL REQUESTS: NORMAL

## 2016-12-26 MED ORDER — BUTORPHANOL TARTRATE 1 MG/ML IJ SOLN
1.0000 mg | Freq: Once | INTRAMUSCULAR | Status: AC
  Administered 2016-12-26: 1 mg via INTRAVENOUS
  Filled 2016-12-26: qty 1

## 2016-12-26 MED ORDER — LACTATED RINGERS IV BOLUS (SEPSIS)
1000.0000 mL | Freq: Once | INTRAVENOUS | Status: AC
  Administered 2016-12-26: 1000 mL via INTRAVENOUS

## 2016-12-26 MED ORDER — CYCLOBENZAPRINE HCL 10 MG PO TABS
10.0000 mg | ORAL_TABLET | Freq: Three times a day (TID) | ORAL | Status: DC | PRN
  Filled 2016-12-26: qty 1

## 2016-12-26 NOTE — Progress Notes (Signed)
Antepartum Note Hospital Day #2 Humberto SealsMaria B Rosales Alonso is a 41 y.o. [id number] at 255w3d presented for pyelonephritis. Since admission has had episodes of preterm contractions.  S:  Feeling ctx over the last 2 hrs. Cannot quantify frequency or duration. No VB or LOF. Reports good FM. HA earlier, improved by Tylenol. Reports pain in mid back, R>L. No fevers. Emesis x1 this am after jello.  O:  BP 114/62 (BP Location: Left Arm)   Pulse 68   Temp 98 F (36.7 C) (Oral)   Resp 16   Ht 5\' 4"  (1.626 m)   Wt 83.9 kg (185 lb)   LMP 05/08/2016   SpO2 100%   BMI 31.76 kg/m  EFM: baseline 135 bpm/ mod variability/ + accels/ no decels  Toco: irregular w/irritability SVE: 1/60/ballotable   A/P: 41 y.o. G5P3013 4255w3d  29.[redacted] weeks gestation Presumed pyelonephritis Threatened PTL Will try dose of Stadol and Flexeril now UC pending Back pain likely MSK Watch closely for PTL Clear liquids for now  Donette LarryMelanie Conchita Truxillo, CNM 11:22 AM

## 2016-12-27 ENCOUNTER — Other Ambulatory Visit (HOSPITAL_COMMUNITY): Payer: Self-pay | Admitting: *Deleted

## 2016-12-27 DIAGNOSIS — O359XX0 Maternal care for (suspected) fetal abnormality and damage, unspecified, not applicable or unspecified: Secondary | ICD-10-CM

## 2016-12-27 DIAGNOSIS — Z3A29 29 weeks gestation of pregnancy: Secondary | ICD-10-CM

## 2016-12-27 DIAGNOSIS — O2303 Infections of kidney in pregnancy, third trimester: Secondary | ICD-10-CM

## 2016-12-27 MED ORDER — NIFEDIPINE ER 30 MG PO TB24: 30.0000 mg | ORAL_TABLET | Freq: Two times a day (BID) | ORAL | 2 refills | Status: AC

## 2016-12-27 MED ORDER — METRONIDAZOLE 500 MG PO TABS: 500.0000 mg | ORAL_TABLET | Freq: Three times a day (TID) | ORAL | 0 refills | Status: DC

## 2016-12-27 NOTE — Progress Notes (Signed)
Discharge instructions given to patient via Eda, spanish interpreter.  Patient verbalized an understanding of discharge instructions.

## 2016-12-27 NOTE — Discharge Summary (Signed)
Physician Discharge Summary  Patient ID: Amanda [name] MRN: [id number] DOB/AGE: [birth year] 40 y.o.  Admit date: 12/24/2016 Discharge date: 12/27/2016  Admission Diagnoses: 4647w5d with pyelonephritis Discharge Diagnoses:  Active Problems:   Pyelonephritis affecting pregnancy in third trimester Gemella Morbillorum + culture  Discharged Condition: good  Hospital Course: pt was admitted with threatened preterm labor and possible pyelonephritis She responded well to IV rocephin and oral procardia xl 30 Interestingly her culture retruned as Gemella morbillorum which is a facultative anaerobic bacteria which is a normal oral flora but can cause pathological infections, discharged on flagyl which is unusual for a pyelonephritis but on evaluating the literature, flagyl is the drug of choice  Consults: None  Significant Diagnostic Studies: labs:   Treatments: antibiotics: ceftriaxone  Discharge Exam: Blood pressure 111/61, pulse 70, temperature 98.3 F (36.8 C), temperature source Oral, resp. rate 18, height 5\' 4"  (1.626 m), weight 185 lb (83.9 kg), last menstrual period 05/08/2016, SpO2 99 %. General appearance: alert Back: no CVAT GI: benign  Disposition: 01-Home or Self Care  Discharge Instructions    Call MD for:  persistant nausea and vomiting    Complete by:  As directed    Call MD for:  severe uncontrolled pain    Complete by:  As directed    Call MD for:  temperature >100.4    Complete by:  As directed    Diet - low sodium heart healthy    Complete by:  As directed    Increase activity slowly    Complete by:  As directed      Allergies as of 12/27/2016   No Known Allergies     Medication List    TAKE these medications   acetaminophen 500 MG chewable tablet Commonly known as:  TYLENOL Chew 500 mg by mouth every 6 (six) hours as needed for pain.   metroNIDAZOLE 500 MG tablet Commonly known as:  FLAGYL Take 1 tablet (500 mg total) by mouth 3 (three) times  daily.   NIFEdipine 30 MG 24 hr tablet Commonly known as:  PROCARDIA-XL/ADALAT CC Take 1 tablet (30 mg total) by mouth 2 (two) times daily.   Prenatal Vitamin 27-0.8 MG Tabs Take 1 tablet by mouth daily.      Follow-up Information    [facility] Follow up.   Contact information: [address] [place] [phone]          Signed: Lazaro ArmsURE,Zenobia Kuennen H 12/27/2016, 7:19 AM

## 2016-12-27 NOTE — Progress Notes (Signed)
Patient had 5 contractions in 40 minutes.  Patient stated not feeling any of them and I am unable to palpate any contractions.  FHT baseline 140's.

## 2016-12-27 NOTE — Discharge Instructions (Signed)
Pielonefritis en los adultos °(Pyelonephritis, Adult) °La pielonefritis es una infección del riñón. Los riñones son los órganos que filtran la sangre y eliminan los residuos del torrente sanguíneo a través de la orina. La orina pasa desde los riñones, a través de los uréteres, hacia la vejiga. Hay dos tipos principales de pielonefritis: °· Infecciones que se inician rápidamente sin síntomas previos (pielonefritis aguda). °· Infecciones que persisten durante un período prolongado (pielonefritis crónica). °En la mayoría de los casos, la infección desaparece con el tratamiento y no causa otros problemas. Las infecciones más graves o crónicas a veces pueden propagarse al torrente sanguíneo u ocasionar otros problemas en los riñones. °CAUSAS °Por lo general, entre las causas de esta afección, se incluyen las siguientes: °· Bacterias que pasan desde la vejiga al riñón a través de la orina infectada. La orina de la vejiga puede infectarse por bacterias relacionadas con estas causas: °? Infección en la vejiga (cistitis). °? Inflamación de la próstata (prostatitis). °? Relaciones sexuales en las mujeres. °· Bacterias que pasan del torrente sanguíneo al riñón. °FACTORES DE RIESGO °Es más probable que esta afección se manifieste en: °· Las embarazadas. °· Las personas de edad avanzada. °· Los diabéticos. °· Las personas que tienen cálculos en los riñones o la vejiga. °· Las personas que tienen otras anomalías en el riñón o los uréteres. °· Las personas que tienen una sonda vesical. °· Las personas con cáncer. °· Las personas que son sexualmente activas. °· Las mujeres que usan espermicidas. °· Las personas que han tenido una infección previa en las vías urinarias. °SÍNTOMAS °Los síntomas de esta afección incluyen lo siguiente: °· Ganas frecuentes de orinar. °· Necesidad intensa o persistente de orinar. °· Sensación de ardor o escozor al orinar. °· Dolor abdominal. °· Dolor de espalda. °· Dolor al costado del cuerpo o en la  fosa lumbar. °· Fiebre. °· Escalofríos. °· Sangre en la orina u orina oscura. °· Náuseas. °· Vómitos. °DIAGNÓSTICO °Esta afección se puede diagnosticar en función de lo siguiente: °· Examen físico e historia clínica. °· Análisis de orina. °· Análisis de sangre. °También pueden hacerle estudios de diagnóstico por imágenes de los riñones, por ejemplo, una ecografía o una tomografía computarizada. °TRATAMIENTO °El tratamiento de esta afección puede depender de la gravedad de la infección. °· Si la infección es leve y se detecta rápidamente, pueden administrarle antibióticos por vía oral. Deberá tomar líquido para permanecer hidratado. °· Si la infección es más grave, es posible que deban hospitalizarlo para administrarle antibióticos directamente en una vena a través de una vía intravenosa (IV). Quizás también deban administrarle líquidos a través de una vía intravenosa si no se encuentra bien hidratado. Después de la hospitalización, es posible que deba tomar antibióticos durante un tiempo. °Podrán prescribirle otros tratamientos según la causa de la infección. °INSTRUCCIONES PARA EL CUIDADO EN EL HOGAR °Medicamentos °· Tome los medicamentos de venta libre y los recetados solamente como se lo haya indicado el médico. °· Si le recetaron un antibiótico, tómelo como se lo haya indicado el médico. No deje de tomar los antibióticos aunque comience a sentirse mejor. °Instrucciones generales °· Beba suficiente líquido para mantener la orina clara o de color amarillo pálido. °· Evite la cafeína, el té y las bebidas gaseosas. Estas sustancias irritan la vejiga. °· Orine con frecuencia. Evite retener la orina durante largos períodos. °· Orine antes y después de las relaciones sexuales. °· Después de defecar, las mujeres deben higienizarse la región perineal desde adelante hacia atrás. Use cada trozo de   papel higiénico solo una vez. °· Concurra a todas las visitas de control como se lo haya indicado el médico. Esto es  importante. °SOLICITE ATENCIÓN MÉDICA SI: °· Los síntomas no mejoran después de 2 días de tratamiento. °· Los síntomas empeoran. °· Tiene fiebre. °SOLICITE ATENCIÓN MÉDICA DE INMEDIATO SI: °· No puede tomar los antibióticos ni ingerir líquidos. °· Comienza a sentir escalofríos. °· Vomita. °· Siente un dolor intenso en la espalda o en la fosa lumbar. °· Se desmaya o siente una debilidad extrema. °Esta información no tiene como fin reemplazar el consejo del médico. Asegúrese de hacerle al médico cualquier pregunta que tenga. °Document Released: 04/18/2005 Document Revised: 03/30/2015 Document Reviewed: 11/01/2014 °Elsevier Interactive Patient Education © 2018 Elsevier Inc. ° °

## 2016-12-27 NOTE — H&P (Signed)
Obstetrics Admission History & Physical  12/27/2016 - 3:55 PM Primary OBGYN: [facility]  Chief Complaint: right sided back pain  History of Present Illness  [age] y.o. [id number] @ 1737w2d with the above CC.  Patient seen in the MAU and diagnosed with presumptive right sided pyelo.   No events overnight and states right sided discomfort feels better.   No fevers, chills, nausea, vomiting, decreased FM or PTL  Review of Systems: as noted in the History of Present Illness.   PMHx:  Past Medical History:  Diagnosis Date  . 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  Normal fetal ECHO and NIPT   . Depression    postpartum 2003  . Headache   . Medical history non-contributory    PSHx:  Past Surgical History:  Procedure Laterality Date  . ECTOPIC PREGNANCY SURGERY     Medications:  No prescriptions prior to admission.     Allergies: has No Known Allergies. OBHx:  OB History  Gravida Para Term Preterm AB Living  5 3 3   1 3   SAB TAB Ectopic Multiple Live Births      1   3    # Outcome Date GA Lbr Len/2nd Weight Sex Delivery Anes PTL Lv  5 Current           4 Term 07/05/07    M Vag-Spont EPI  LIV  3 Term 08/10/04    M Vag-Spont EPI  LIV  2 Term 06/06/02    F Vag-Spont None  LIV  1 Ectopic 2000                 FHx:  Family History  Problem Relation Age of Onset  . Diabetes Mother   . Diabetes Maternal Uncle   . Diabetes Maternal Grandmother   . Cancer Paternal Grandfather    Soc Hx:  Social History   Social History  . Marital status: Married    Spouse name: N/A  . Number of children: N/A  . Years of education: N/A   Occupational History  . Not on file.   Social History Main Topics  . Smoking status: Former Smoker    Quit date: 07/23/2000  . Smokeless tobacco: Never Used  . Alcohol use No  . Drug use: No  . Sexual activity: Yes    Birth control/ protection: None   Other Topics Concern  .  Not on file   Social History Narrative  . No narrative on file    Objective  AF VS normal and stable  General: Well nourished, well developed female in no acute distress.  Skin:  Warm and dry.  Cardiovascular: S1, S2 normal, no murmur, rub or gallop, regular rate and rhythm Respiratory:  Clear to auscultation bilateral. Normal respiratory effort Abdomen: gravid nttp Back: mild right CVAT Neuro/Psych:  Normal mood and affect.   Labs  Pending: UCx  Recent Labs Lab 12/24/16 1203 12/25/16 0638  WBC 6.1 5.3  HGB 11.9* 10.8*  HCT 33.3* 31.5*  PLT 173 148*     Recent Labs Lab 12/25/16 0638  NA 135  K 3.8  CL 107  CO2 22  BUN 10  CREATININE 0.46  CALCIUM 8.1*  GLUCOSE 88    Radiology None  Assessment & Plan   41 y.o. [id number] @ 3219w4d with right sided pyelo. Pt stable *Pregnancy: routine care. qday NSTs *right sided pyelo: continue with rocephin. F/u ucx *FEN/GI: can decrease IVFs since  voiding a lot. Regular diet *PPx: SCDs, OOB ad lib *Dispo: likely in the next day or two.   Interpreter used.   Cornelia Copa MD Attending [facility])

## 2016-12-28 ENCOUNTER — Encounter (HOSPITAL_COMMUNITY): Payer: Self-pay

## 2016-12-28 ENCOUNTER — Ambulatory Visit (HOSPITAL_COMMUNITY)
Admission: RE | Admit: 2016-12-28 | Discharge: 2016-12-28 | Disposition: A | Discharge status: Home / Self Care | Payer: Self-pay | Source: Ambulatory Visit | Attending: Obstetrics & Gynecology | Admitting: Obstetrics & Gynecology

## 2016-12-28 DIAGNOSIS — O0990 Supervision of high risk pregnancy, unspecified, unspecified trimester: Secondary | ICD-10-CM

## 2016-12-28 DIAGNOSIS — O99613 Diseases of the digestive system complicating pregnancy, third trimester: Secondary | ICD-10-CM | POA: Insufficient documentation

## 2016-12-28 DIAGNOSIS — O9989 Other specified diseases and conditions complicating pregnancy, childbirth and the puerperium: Secondary | ICD-10-CM | POA: Insufficient documentation

## 2016-12-28 DIAGNOSIS — O09523 Supervision of elderly multigravida, third trimester: Secondary | ICD-10-CM | POA: Insufficient documentation

## 2016-12-28 DIAGNOSIS — Q790 Congenital diaphragmatic hernia: Secondary | ICD-10-CM | POA: Insufficient documentation

## 2016-12-28 DIAGNOSIS — Z8279 Family history of other congenital malformations, deformations and chromosomal abnormalities: Secondary | ICD-10-CM | POA: Insufficient documentation

## 2016-12-28 DIAGNOSIS — Z3A29 29 weeks gestation of pregnancy: Secondary | ICD-10-CM | POA: Insufficient documentation

## 2016-12-28 DIAGNOSIS — O403XX0 Polyhydramnios, third trimester, not applicable or unspecified: Secondary | ICD-10-CM | POA: Insufficient documentation

## 2016-12-31 ENCOUNTER — Ambulatory Visit (INDEPENDENT_AMBULATORY_CARE_PROVIDER_SITE_OTHER): Payer: Self-pay | Admitting: Obstetrics & Gynecology

## 2016-12-31 ENCOUNTER — Ambulatory Visit (INDEPENDENT_AMBULATORY_CARE_PROVIDER_SITE_OTHER): Payer: Self-pay | Admitting: Clinical

## 2016-12-31 VITALS — BP 109/72 | HR 81 | Wt 187.0 lb

## 2016-12-31 DIAGNOSIS — O09292 Supervision of pregnancy with other poor reproductive or obstetric history, second trimester: Secondary | ICD-10-CM

## 2016-12-31 DIAGNOSIS — O09523 Supervision of elderly multigravida, third trimester: Secondary | ICD-10-CM

## 2016-12-31 DIAGNOSIS — R4589 Other symptoms and signs involving emotional state: Secondary | ICD-10-CM

## 2016-12-31 DIAGNOSIS — O0990 Supervision of high risk pregnancy, unspecified, unspecified trimester: Secondary | ICD-10-CM

## 2016-12-31 DIAGNOSIS — O2303 Infections of kidney in pregnancy, third trimester: Secondary | ICD-10-CM

## 2016-12-31 DIAGNOSIS — Z23 Encounter for immunization: Secondary | ICD-10-CM

## 2016-12-31 DIAGNOSIS — O09293 Supervision of pregnancy with other poor reproductive or obstetric history, third trimester: Secondary | ICD-10-CM

## 2016-12-31 DIAGNOSIS — O0993 Supervision of high risk pregnancy, unspecified, third trimester: Secondary | ICD-10-CM

## 2016-12-31 DIAGNOSIS — F4323 Adjustment disorder with mixed anxiety and depressed mood: Secondary | ICD-10-CM

## 2016-12-31 MED ORDER — RANITIDINE HCL 150 MG PO TABS: 150.0000 mg | ORAL_TABLET | Freq: Two times a day (BID) | ORAL | 2 refills | Status: AC

## 2016-12-31 NOTE — Progress Notes (Signed)
   PRENATAL VISIT NOTE  Subjective:  Amanda Beard is a 41 y.o. [phone] at 3722w1d being seen today for ongoing prenatal care.  She is currently monitored for the following issues for this high-risk pregnancy and has Abnormal fetal ultrasound; Supervision of high risk pregnancy, antepartum; H/O fetal anomaly in prior pregnancy, currently pregnant, second trimester; AMA (advanced maternal age) multigravida 35+; and Pyelonephritis affecting pregnancy in third trimester on her problem list.  Patient reports heartburn.  Contractions: Irregular. Vag. Bleeding: None.  Movement: Present. Denies leaking of fluid.   The following portions of the patient's history were reviewed and updated as appropriate: allergies, current medications, past family history, past medical history, past social history, past surgical history and problem list. Problem list updated.  Objective:   Vitals:   12/31/16 1506  BP: 109/72  Pulse: 81  Weight: 187 lb (84.8 kg)    Fetal Status: Fetal Heart Rate (bpm): 140   Movement: Present     General:  Alert, oriented and cooperative. Patient is in no acute distress.  Skin: Skin is warm and dry. No rash noted.   Cardiovascular: Normal heart rate noted  Respiratory: Normal respiratory effort, no problems with respiration noted  Abdomen: Soft, gravid, appropriate for gestational age. Pain/Pressure: Present     Pelvic:  Cervical exam deferred        Extremities: Normal range of motion.  Edema: None  Mental Status: Normal mood and affect. Normal behavior. Normal judgment and thought content.   Assessment and Plan:  Pregnancy: [id number] at 5722w1d  1. Pyelonephritis affecting pregnancy in third trimester  - Tdap vaccine greater than or equal to 7yo IM - Ambulatory referral to [facility]  2. Supervision of high risk pregnancy, antepartum Reflux sx - Tdap vaccine greater than or equal to 7yo IM - ranitidine (ZANTAC) 150 MG tablet; Take 1 tablet (150  mg total) by mouth 2 (two) times daily.  Dispense: 60 tablet; Refill: 2  3. H/O fetal anomaly in prior pregnancy, currently pregnant, second trimester [facility] - Tdap vaccine greater than or equal to 7yo IM - Ambulatory referral to [facility]  4. Elderly multigravida in third trimester  - Tdap vaccine greater than or equal to 7yo IM - Ambulatory referral to [facility]  5. Sad mood To see Amanda MuirJamie  - Ambulatory referral to [facility]  Preterm labor symptoms and general obstetric precautions including but not limited to vaginal bleeding, contractions, leaking of fluid and fetal movement were reviewed in detail with the patient. Please refer to After Visit Summary for other counseling recommendations.  Return in about 2 weeks (around 01/14/2017).   Scheryl DarterJames Aidan Caloca, MD

## 2016-12-31 NOTE — BH Specialist Note (Signed)
Integrated Behavioral Health Initial Visit  MRN: [id number] Name: Amanda Beard   Session Start time: 4:00 Session End time: 4:27 Total time: 30 minutes  Type of Service: Integrated Behavioral Health- Individual/Family Interpretor:Yes.   Interpretor Name and Language: Spanish   Warm Hand Off Completed.       SUBJECTIVE: Amanda Beard is a 41 y.o. female accompanied by patient and daughter. Patient was referred by Dr Debroah LoopArnold for depression. Patient reports the following symptoms/concerns: Pt states that her primary symptoms are difficulty falling asleep and low appetite, that she attributes to worry about unknowns with current pregnancy. Duration of problem: Current pregnancy; Severity of problem: moderate   OBJECTIVE: Mood: Anxious and Depressed and Affect: Depressed Risk of harm to self or others: No plan to harm self or others   LIFE CONTEXT: Family and Social: Lives with husband and three children  School/Work: - Self-Care: Hot showers  Life Changes: Current pregnancy  GOALS ADDRESSED: Patient will reduce symptoms of: anxiety, depression and stress and increase knowledge and/or ability of: self-management skills and also: Increase healthy adjustment to current life circumstances   INTERVENTIONS: Mindfulness or Relaxation Training and Psychoeducation and/or Health Education  Standardized Assessments completed: GAD-7 and PHQ 9  ASSESSMENT: Patient currently experiencing Adjustment disorder with anxious and depressed mood. Patient may benefit from psychoeducation and brief therapeutic interventions regarding coping with symptoms of anxiety and dperession.Marland Kitchen.  PLAN: 1. Follow up with behavioral health clinician on : Two weeks(next medical appointment) 2. Behavioral recommendations:  - Relaxation breathing exercise at least once daily; repeat as needed throughout the day -Sleep app for improved family sleep -Read educational materials regarding coping  with symptoms of anxiety and depression 3. Referral(s): [facility]  Valetta CloseJamie C SonterraMcMannes, [place]  Depression screen [facility] 2/9 12/31/2016 12/03/2016 10/24/2016  Decreased Interest 3 1 0  Down, Depressed, Hopeless 0 0 0  PHQ - 2 Score 3 1 0  Altered sleeping 3 1 0  Tired, decreased energy 3 3 0  Change in appetite 3 3 1   Feeling bad or failure about yourself  0 0 0  Trouble concentrating 2 2 1   Moving slowly or fidgety/restless 3 3 1   Suicidal thoughts 0 0 0  PHQ-9 Score 17 13 3    GAD 7 : Generalized Anxiety Score 12/31/2016 12/03/2016 10/24/2016  Nervous, Anxious, on Edge 1 1 0  Control/stop worrying 1 0 0  Worry too much - different things 1 1 0  Trouble relaxing 1 0 0  Restless 1 0 0  Easily annoyed or irritable 1 1 0  Afraid - awful might happen 0 0 0  Total GAD 7 Score 6 3 0

## 2016-12-31 NOTE — Patient Instructions (Signed)
Tercer trimestre de embarazo (Third Trimester of Pregnancy) El tercer trimestre comprende desde la semana29 hasta la semana42, es decir, desde el mes7 hasta el mes9. El tercer trimestre es un perodo en el que el feto crece rpidamente. Hacia el final del noveno mes, el feto mide alrededor de 20pulgadas (45cm) de largo y pesa entre 6 y 10 libras (2,700 y 4,500kg). CAMBIOS EN EL ORGANISMO Su organismo atraviesa por muchos cambios durante el embarazo, y estos varan de una mujer a otra.  Seguir aumentando de peso. Es de esperar que aumente entre 25 y 35libras (11 y 16kg) hacia el final del embarazo.  Podrn aparecer las primeras estras en las caderas, el abdomen y las mamas.  Puede tener necesidad de orinar con ms frecuencia porque el feto baja hacia la pelvis y ejerce presin sobre la vejiga.  Debido al embarazo podr sentir acidez estomacal con frecuencia.  Puede estar estreida, ya que ciertas hormonas enlentecen los movimientos de los msculos que empujan los desechos a travs de los intestinos.  Pueden aparecer hemorroides o abultarse e hincharse las venas (venas varicosas).  Puede sentir dolor plvico debido al aumento de peso y a que las hormonas del embarazo relajan las articulaciones entre los huesos de la pelvis. El dolor de espalda puede ser consecuencia de la sobrecarga de los msculos que soportan la postura.  Tal vez haya cambios en el cabello que pueden incluir su engrosamiento, crecimiento rpido y cambios en la textura. Adems, a algunas mujeres se les cae el cabello durante o despus del embarazo, o tienen el cabello seco o fino. Lo ms probable es que el cabello se le normalice despus del nacimiento del beb.  Las mamas seguirn creciendo y le dolern. A veces, puede haber una secrecin amarilla de las mamas llamada calostro.  El ombligo puede salir hacia afuera.  Puede sentir que le falta el aire debido a que se expande el tero.  Puede notar que el feto  "baja" o lo siente ms bajo, en el abdomen.  Puede tener una prdida de secrecin mucosa con sangre. Esto suele ocurrir en el trmino de unos pocos das a una semana antes de que comience el trabajo de parto.  El cuello del tero se vuelve delgado y blando (se borra) cerca de la fecha de parto. QU DEBE ESPERAR EN LOS EXMENES PRENATALES Le harn exmenes prenatales cada 2semanas hasta la semana36. A partir de ese momento le harn exmenes semanales. Durante una visita prenatal de rutina:  La pesarn para asegurarse de que usted y el feto estn creciendo normalmente.  Le tomarn la presin arterial.  Le medirn el abdomen para controlar el desarrollo del beb.  Se escucharn los latidos cardacos fetales.  Se evaluarn los resultados de los estudios solicitados en visitas anteriores.  Le revisarn el cuello del tero cuando est prxima la fecha de parto para controlar si este se ha borrado. Alrededor de la semana36, el mdico le revisar el cuello del tero. Al mismo tiempo, realizar un anlisis de las secreciones del tejido vaginal. Este examen es para determinar si hay un tipo de bacteria, estreptococo Grupo B. El mdico le explicar esto con ms detalle. El mdico puede preguntarle lo siguiente:  Cmo le gustara que fuera el parto.  Cmo se siente.  Si siente los movimientos del beb.  Si ha tenido sntomas anormales, como prdida de lquido, sangrado, dolores de cabeza intensos o clicos abdominales.  Si est consumiendo algn producto que contenga tabaco, como cigarrillos, tabaco de mascar y   cigarrillos electrnicos.  Si tiene alguna pregunta. Otros exmenes o estudios de deteccin que pueden realizarse durante el tercer trimestre incluyen lo siguiente:  Anlisis de sangre para controlar los niveles de hierro (anemia).  Controles fetales para determinar su salud, nivel de actividad y crecimiento. Si tiene alguna enfermedad o hay problemas durante el embarazo, le harn  estudios.  Prueba del VIH (virus de inmunodeficiencia humana). Si corre un riesgo alto, pueden realizarle una prueba de deteccin del VIH durante el tercer trimestre del embarazo. FALSO TRABAJO DE PARTO Es posible que sienta contracciones leves e irregulares que finalmente desaparecen. Se llaman contracciones de Braxton Hicks o falso trabajo de parto. Las contracciones pueden durar horas, das o incluso semanas, antes de que el verdadero trabajo de parto se inicie. Si las contracciones ocurren a intervalos regulares, se intensifican o se hacen dolorosas, lo mejor es que la revise el mdico. SIGNOS DE TRABAJO DE PARTO  Clicos de tipo menstrual.  Contracciones cada 5minutos o menos.  Contracciones que comienzan en la parte superior del tero y se extienden hacia abajo, a la zona inferior del abdomen y la espalda.  Sensacin de mayor presin en la pelvis o dolor de espalda.  Una secrecin de mucosidad acuosa o con sangre que sale de la vagina. Si tiene alguno de estos signos antes de la semana37 del embarazo, llame a su mdico de inmediato. Debe concurrir al hospital para que la controlen inmediatamente. INSTRUCCIONES PARA EL CUIDADO EN EL HOGAR  Evite fumar, consumir hierbas, beber alcohol y tomar frmacos que no le hayan recetado. Estas sustancias qumicas afectan la formacin y el desarrollo del beb.  No consuma ningn producto que contenga tabaco, lo que incluye cigarrillos, tabaco de mascar y cigarrillos electrnicos. Si necesita ayuda para dejar de fumar, consulte al mdico. Puede recibir asesoramiento y otro tipo de recursos para dejar de fumar.  Siga las indicaciones del mdico en relacin con el uso de medicamentos. Durante el embarazo, hay medicamentos que son seguros de tomar y otros que no.  Haga ejercicio solamente como se lo haya indicado el mdico. Sentir clicos uterinos es un buen signo para detener la actividad fsica.  Contine comiendo alimentos sanos con  regularidad.  Use un sostn que le brinde buen soporte si le duelen las mamas.  No se d baos de inmersin en agua caliente, baos turcos ni saunas.  Use el cinturn de seguridad en todo momento mientras conduce.  No coma carne cruda ni queso sin cocinar; evite el contacto con las bandejas sanitarias de los gatos y la tierra que estos animales usan. Estos elementos contienen grmenes que pueden causar defectos congnitos en el beb.  Tome las vitaminas prenatales.  Tome entre 1500 y 2000mg de calcio diariamente comenzando en la semana20 del embarazo hasta el parto.  Si est estreida, pruebe un laxante suave (si el mdico lo autoriza). Consuma ms alimentos ricos en fibra, como vegetales y frutas frescos y cereales integrales. Beba gran cantidad de lquido para mantener la orina de tono claro o color amarillo plido.  Dese baos de asiento con agua tibia para aliviar el dolor o las molestias causadas por las hemorroides. Use una crema para las hemorroides si el mdico la autoriza.  Si tiene venas varicosas, use medias de descanso. Eleve los pies durante 15minutos, 3 o 4veces por da. Limite el consumo de sal en su dieta.  Evite levantar objetos pesados, use zapatos de tacones bajos y mantenga una buena postura.  Descanse con las piernas elevadas si tiene   calambres o dolor de cintura.  Visite a su dentista si no lo ha hecho durante el embarazo. Use un cepillo de dientes blando para higienizarse los dientes y psese el hilo dental con suavidad.  Puede seguir manteniendo relaciones sexuales, a menos que el mdico le indique lo contrario.  No haga viajes largos excepto que sea absolutamente necesario y solo con la autorizacin del mdico.  Tome clases prenatales para entender, practicar y hacer preguntas sobre el trabajo de parto y el parto.  Haga un ensayo de la partida al hospital.  Prepare el bolso que llevar al hospital.  Prepare la habitacin del beb.  Concurra a todas  las visitas prenatales segn las indicaciones de su mdico.  SOLICITE ATENCIN MDICA SI:  No est segura de que est en trabajo de parto o de que ha roto la bolsa de las aguas.  Tiene mareos.  Siente clicos leves, presin en la pelvis o dolor persistente en el abdomen.  Tiene nuseas, vmitos o diarrea persistentes.  Observa una secrecin vaginal con mal olor.  Siente dolor al orinar.  SOLICITE ATENCIN MDICA DE INMEDIATO SI:  Tiene fiebre.  Tiene una prdida de lquido por la vagina.  Tiene sangrado o pequeas prdidas vaginales.  Siente dolor intenso o clicos en el abdomen.  Sube o baja de peso rpidamente.  Tiene dificultad para respirar y siente dolor de pecho.  Sbitamente se le hinchan mucho el rostro, las manos, los tobillos, los pies o las piernas.  No ha sentido los movimientos del beb durante una hora.  Siente un dolor de cabeza intenso que no se alivia con medicamentos.  Su visin se modifica.  Esta informacin no tiene como fin reemplazar el consejo del mdico. Asegrese de hacerle al mdico cualquier pregunta que tenga. Document Released: 04/18/2005 Document Revised: 07/30/2014 Document Reviewed: 09/09/2012 Elsevier Interactive Patient Education  2017 Elsevier Inc.  

## 2016-12-31 NOTE — Progress Notes (Signed)
Patient reports contractions throughout the day, slightly less compared to recent hospital admission  [facility] Dr Debroah LoopArnold aware

## 2017-01-04 ENCOUNTER — Encounter (HOSPITAL_COMMUNITY): Payer: Self-pay

## 2017-01-04 ENCOUNTER — Ambulatory Visit (HOSPITAL_COMMUNITY)
Admission: RE | Admit: 2017-01-04 | Discharge: 2017-01-04 | Disposition: A | Discharge status: Home / Self Care | Payer: Self-pay | Source: Ambulatory Visit | Attending: Obstetrics & Gynecology | Admitting: Obstetrics & Gynecology

## 2017-01-04 DIAGNOSIS — Z3A30 30 weeks gestation of pregnancy: Secondary | ICD-10-CM | POA: Insufficient documentation

## 2017-01-04 DIAGNOSIS — O99891 Other specified diseases and conditions complicating pregnancy: Secondary | ICD-10-CM

## 2017-01-04 DIAGNOSIS — O403XX0 Polyhydramnios, third trimester, not applicable or unspecified: Secondary | ICD-10-CM | POA: Insufficient documentation

## 2017-01-04 DIAGNOSIS — Z8279 Family history of other congenital malformations, deformations and chromosomal abnormalities: Secondary | ICD-10-CM | POA: Insufficient documentation

## 2017-01-04 DIAGNOSIS — O09523 Supervision of elderly multigravida, third trimester: Secondary | ICD-10-CM | POA: Insufficient documentation

## 2017-01-04 DIAGNOSIS — O9989 Other specified diseases and conditions complicating pregnancy, childbirth and the puerperium: Secondary | ICD-10-CM | POA: Insufficient documentation

## 2017-01-04 DIAGNOSIS — O358XX0 Maternal care for other (suspected) fetal abnormality and damage, not applicable or unspecified: Secondary | ICD-10-CM

## 2017-01-04 DIAGNOSIS — Q790 Congenital diaphragmatic hernia: Secondary | ICD-10-CM | POA: Insufficient documentation

## 2017-01-04 DIAGNOSIS — O35FXX0 Maternal care for other (suspected) fetal abnormality and damage, fetal musculoskeletal anomalies of trunk, not applicable or unspecified: Secondary | ICD-10-CM

## 2017-01-04 DIAGNOSIS — O09893 Supervision of other high risk pregnancies, third trimester: Secondary | ICD-10-CM | POA: Insufficient documentation

## 2017-01-06 ENCOUNTER — Encounter (HOSPITAL_COMMUNITY): Payer: Self-pay | Admitting: *Deleted

## 2017-01-06 ENCOUNTER — Inpatient Hospital Stay (HOSPITAL_COMMUNITY)
Admission: AD | Admit: 2017-01-06 | Discharge: 2017-01-06 | Disposition: A | Discharge status: Home / Self Care | Payer: Self-pay | Source: Ambulatory Visit | Attending: Obstetrics & Gynecology | Admitting: Obstetrics & Gynecology

## 2017-01-06 DIAGNOSIS — O26893 Other specified pregnancy related conditions, third trimester: Secondary | ICD-10-CM | POA: Insufficient documentation

## 2017-01-06 DIAGNOSIS — F329 Major depressive disorder, single episode, unspecified: Secondary | ICD-10-CM | POA: Insufficient documentation

## 2017-01-06 DIAGNOSIS — M549 Dorsalgia, unspecified: Secondary | ICD-10-CM | POA: Insufficient documentation

## 2017-01-06 DIAGNOSIS — Z3A31 31 weeks gestation of pregnancy: Secondary | ICD-10-CM | POA: Insufficient documentation

## 2017-01-06 DIAGNOSIS — Z87891 Personal history of nicotine dependence: Secondary | ICD-10-CM | POA: Insufficient documentation

## 2017-01-06 DIAGNOSIS — O99343 Other mental disorders complicating pregnancy, third trimester: Secondary | ICD-10-CM | POA: Insufficient documentation

## 2017-01-06 DIAGNOSIS — O9989 Other specified diseases and conditions complicating pregnancy, childbirth and the puerperium: Secondary | ICD-10-CM

## 2017-01-06 DIAGNOSIS — O0990 Supervision of high risk pregnancy, unspecified, unspecified trimester: Secondary | ICD-10-CM

## 2017-01-06 DIAGNOSIS — O09522 Supervision of elderly multigravida, second trimester: Secondary | ICD-10-CM

## 2017-01-06 DIAGNOSIS — O99891 Other specified diseases and conditions complicating pregnancy: Secondary | ICD-10-CM

## 2017-01-06 LAB — CBC WITH DIFFERENTIAL/PLATELET
Basophils Absolute: 0 10*3/uL (ref 0.0–0.1)
Basophils Relative: 0 %
EOS PCT: 3 %
Eosinophils Absolute: 0.1 10*3/uL (ref 0.0–0.7)
HCT: 32.7 % — ABNORMAL LOW (ref 36.0–46.0)
Hemoglobin: 11.5 g/dL — ABNORMAL LOW (ref 12.0–15.0)
LYMPHS ABS: 1.8 10*3/uL (ref 0.7–4.0)
LYMPHS PCT: 31 %
MCH: 32.6 pg (ref 26.0–34.0)
MCHC: 35.2 g/dL (ref 30.0–36.0)
MCV: 92.6 fL (ref 78.0–100.0)
MONO ABS: 0.2 10*3/uL (ref 0.1–1.0)
MONOS PCT: 3 %
Neutro Abs: 3.6 10*3/uL (ref 1.7–7.7)
Neutrophils Relative %: 63 %
PLATELETS: 185 10*3/uL (ref 150–400)
RBC: 3.53 MIL/uL — ABNORMAL LOW (ref 3.87–5.11)
RDW: 12.6 % (ref 11.5–15.5)
WBC: 5.7 10*3/uL (ref 4.0–10.5)

## 2017-01-06 LAB — URINALYSIS, ROUTINE W REFLEX MICROSCOPIC
BILIRUBIN URINE: NEGATIVE
Glucose, UA: NEGATIVE mg/dL
Hgb urine dipstick: NEGATIVE
KETONES UR: 5 mg/dL — AB
Nitrite: NEGATIVE
Protein, ur: NEGATIVE mg/dL
Specific Gravity, Urine: 1.014 (ref 1.005–1.030)
pH: 7 (ref 5.0–8.0)

## 2017-01-06 LAB — WET PREP, GENITAL
Clue Cells Wet Prep HPF POC: NONE SEEN
Sperm: NONE SEEN
TRICH WET PREP: NONE SEEN
YEAST WET PREP: NONE SEEN

## 2017-01-06 LAB — BASIC METABOLIC PANEL
Anion gap: 6 (ref 5–15)
BUN: 13 mg/dL (ref 6–20)
CALCIUM: 8.6 mg/dL — AB (ref 8.9–10.3)
CO2: 22 mmol/L (ref 22–32)
Chloride: 105 mmol/L (ref 101–111)
Creatinine, Ser: 0.56 mg/dL (ref 0.44–1.00)
GFR calc Af Amer: >60 mL/min (ref >60)
GLUCOSE: 86 mg/dL (ref 65–99)
POTASSIUM: 3.7 mmol/L (ref 3.5–5.1)
Sodium: 133 mmol/L — ABNORMAL LOW (ref 135–145)

## 2017-01-06 MED ORDER — LACTATED RINGERS IV BOLUS (SEPSIS)
1000.0000 mL | Freq: Once | INTRAVENOUS | Status: AC
  Administered 2017-01-06: 1000 mL via INTRAVENOUS

## 2017-01-06 MED ORDER — CYCLOBENZAPRINE HCL 10 MG PO TABS
10.0000 mg | ORAL_TABLET | Freq: Once | ORAL | Status: AC
  Administered 2017-01-06: 10 mg via ORAL
  Filled 2017-01-06: qty 1

## 2017-01-06 MED ORDER — CYCLOBENZAPRINE HCL 5 MG PO TABS: 5.0000 mg | ORAL_TABLET | Freq: Three times a day (TID) | ORAL | 0 refills | Status: DC | PRN

## 2017-01-06 NOTE — MAU Provider Note (Signed)
[facility] AT [facility]    Provider Note   CSN: [id number] Arrival date & time: 01/06/17  1357     History   Chief Complaint Chief Complaint  Patient presents with  . Back Pain    HPI Amanda Beard is a 41 y.o. [phone] @ [redacted]w[redacted]d with PMH as listed below who presents to the MAU with upper back pain that started 3 days ago and has gotten worse. Drinking cold water makes it better. The pain is worse when she get really hot.  Patient currently taking medication for contractions. She feels that when she has the contractions that the back pain is worse. After she takes her medication the pain seem to improve until today. Today she took her medication and continues to have back pain and feel contractions.   Back Pain  This is a new problem. The current episode started in the past 7 days. The problem occurs constantly. The problem has been gradually worsening since onset. The pain is present in the thoracic spine. Quality: burning. The pain is at a severity of 7/10. Associated symptoms include dysuria. Pertinent negatives include no abdominal pain, bladder incontinence, bowel incontinence, chest pain, fever, headaches or weakness. Improvement on treatment: tylenol and nifedipine.    Past Medical History:  Diagnosis Date  . 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  Normal fetal ECHO and NIPT   . Depression    postpartum 2003  . Headache   . Medical history non-contributory     Patient Active Problem List   Diagnosis Date Noted  . Pyelonephritis affecting pregnancy in third trimester 12/24/2016  . AMA (advanced maternal age) multigravida 35+ 11/04/2016  . H/O fetal anomaly in prior pregnancy, currently pregnant, second trimester 10/24/2016  . Abnormal fetal ultrasound   . Supervision of high risk pregnancy, antepartum     Past Surgical History:  Procedure Laterality Date  . ECTOPIC PREGNANCY SURGERY      OB History    Gravida  Para Term Preterm AB Living   5 3 3   1 3    SAB TAB Ectopic Multiple Live Births       1   3       Home Medications    Prior to Admission medications   Medication Sig Start Date End Date Taking? Authorizing Provider  acetaminophen (TYLENOL) 500 MG tablet Take 500 mg by mouth every 6 (six) hours as needed for mild pain, moderate pain, fever or headache.   Yes [provider]  NIFEdipine (PROCARDIA-XL/ADALAT CC) 30 MG 24 hr tablet Take 1 tablet (30 mg total) by mouth 2 (two) times daily. 12/27/16  Yes Lazaro Arms, MD  Prenatal Vit-Fe Fumarate-FA (PRENATAL VITAMIN) 27-0.8 MG TABS Take 1 tablet by mouth daily. 12/03/16  Yes Levie Heritage, DO  ranitidine (ZANTAC) 150 MG tablet Take 1 tablet (150 mg total) by mouth 2 (two) times daily. 12/31/16  Yes Adam Phenix, MD  cyclobenzaprine (FLEXERIL) 5 MG tablet Take 1 tablet (5 mg total) by mouth 3 (three) times daily as needed for muscle spasms. 01/06/17   Janne Napoleon, NP    Family History Family History  Problem Relation Age of Onset  . Diabetes Mother   . Diabetes Maternal Uncle   . Diabetes Maternal Grandmother   . Cancer Paternal Grandfather     Social History Social History  Substance Use Topics  . Smoking status: Former Smoker    Quit date: 07/23/2000  .  Smokeless tobacco: Former NeurosurgeonUser  . Alcohol use No     Allergies   Patient has no known allergies.   Review of Systems Review of Systems  Constitutional: Negative for chills and fever.  HENT: Negative for congestion, ear pain and sore throat.   Eyes: Negative for visual disturbance.  Respiratory: Positive for cough.   Cardiovascular: Negative for chest pain.  Gastrointestinal: Negative for abdominal pain, bowel incontinence, constipation, diarrhea, nausea and vomiting.  Genitourinary: Positive for dysuria, frequency and vaginal discharge (yellow). Negative for bladder incontinence and vaginal bleeding.  Musculoskeletal: Positive for back pain.  Skin: Negative  for rash.  Neurological: Negative for weakness and headaches.  Psychiatric/Behavioral:       Hx depression     Physical Exam Updated Vital Signs BP 120/70 (BP Location: Left Arm)   Pulse 65   Temp 98.5 F (36.9 C)   Resp 18   Ht 5\' 4"  (1.626 m)   Wt 190 lb 1.9 oz (86.2 kg)   LMP 05/08/2016   BMI 32.63 kg/m   Physical Exam  Constitutional: She is oriented to person, place, and time. She appears well-developed and well-nourished. No distress.  Eyes: EOM are normal.  Neck: Neck supple.  Cardiovascular: Normal rate.   Pulmonary/Chest: Effort normal.  Abdominal: Soft. There is no tenderness.  Firm, gravid, larger than expected for dates due to polyhydramnios   Genitourinary:  Genitourinary Comments: External genitalia without lesions, yellow mucous d/c vaginal vault, cervix inflamed,  Cervical exam:  Dilation: 1 Effacement (%): Thick Station: -3 Exam by:: Morrison Oldee Carter RN   Musculoskeletal: Normal range of motion.  Neurological: She is alert and oriented to person, place, and time. No cranial nerve deficit.  Skin: Skin is warm and dry.  Psychiatric: She has a normal mood and affect. Her behavior is normal.  Nursing note and vitals reviewed.    ED Treatments / Results  Labs (all labs ordered are listed, but only abnormal results are displayed) Labs Reviewed  WET PREP, GENITAL - Abnormal; Notable for the following:       Result Value   WBC, Wet Prep HPF POC FEW (*)    All other components within normal limits  URINALYSIS, ROUTINE W REFLEX MICROSCOPIC - Abnormal; Notable for the following:    Ketones, ur 5 (*)    Leukocytes, UA SMALL (*)    Bacteria, UA FEW (*)    Squamous Epithelial / LPF 0-5 (*)    All other components within normal limits  CBC WITH DIFFERENTIAL/PLATELET - Abnormal; Notable for the following:    RBC 3.53 (*)    Hemoglobin 11.5 (*)    HCT 32.7 (*)    All other components within normal limits  BASIC METABOLIC PANEL - Abnormal; Notable for the  following:    Sodium 133 (*)    Calcium 8.6 (*)    All other components within normal limits  CULTURE, OB URINE  GC/CHLAMYDIA PROBE AMP ([place]) NOT [facility]    Radiology No results found.  Procedures EFM: baseline 140, good variability, reactive tracing, no decels. Occasional contraction, irritability.   Procedures (including critical care time)  Medications Ordered in ED Medications  lactated ringers bolus 1,000 mL (0 mLs Intravenous Stopped 01/06/17 1808)  cyclobenzaprine (FLEXERIL) tablet 10 mg (10 mg Oral Given 01/06/17 1614)     Initial Impression / Assessment and Plan / ED Course  I have reviewed the triage vital signs and the nursing notes. I discussed this case with Dr. Macon LargeAnyanwu. Due  to the patient having polyhydramnios she may have irregular contractions.   Final Clinical Impressions(s) / ED Diagnoses  41 y.o. female with abdominal and back pain stable for d/c after contractions improved with IV fluids and Flexeril in addition to the Procardia the patient took just prior to arrival to the MAU. Discussed with the patient lab results and plan of care. All questions answered. She will continue to follow up for prenatal care in the [facility]. She will return here for worsening symptoms. Rx Flexeril  Final diagnoses:  Back pain affecting pregnancy in third trimester    New Prescriptions Current Discharge Medication List    START taking these medications   Details  cyclobenzaprine (FLEXERIL) 5 MG tablet Take 1 tablet (5 mg total) by mouth 3 (three) times daily as needed for muscle spasms. Qty: 30 tablet, Refills: 0

## 2017-01-06 NOTE — Discharge Instructions (Signed)
Follow up in the [facility]. Return here for worsening symptoms.

## 2017-01-06 NOTE — MAU Note (Addendum)
Patient presents with c/o back pain which she had 2 weeks ago and it has come back today mid to lower back pain.  Having some burning when she urinates, was treated for pyelo 2 weeks ago.

## 2017-01-07 LAB — GC/CHLAMYDIA PROBE AMP (~~LOC~~) NOT AT ARMC
Chlamydia: NEGATIVE
Neisseria Gonorrhea: NEGATIVE

## 2017-01-08 LAB — CULTURE, OB URINE

## 2017-01-11 ENCOUNTER — Ambulatory Visit (HOSPITAL_COMMUNITY)
Admission: RE | Admit: 2017-01-11 | Discharge: 2017-01-11 | Disposition: A | Discharge status: Home / Self Care | Payer: Self-pay | Source: Ambulatory Visit | Attending: Obstetrics & Gynecology | Admitting: Obstetrics & Gynecology

## 2017-01-11 ENCOUNTER — Encounter (HOSPITAL_COMMUNITY): Payer: Self-pay

## 2017-01-11 ENCOUNTER — Other Ambulatory Visit (HOSPITAL_COMMUNITY): Payer: Self-pay | Admitting: Maternal and Fetal Medicine

## 2017-01-11 DIAGNOSIS — Q790 Congenital diaphragmatic hernia: Secondary | ICD-10-CM

## 2017-01-11 DIAGNOSIS — O09523 Supervision of elderly multigravida, third trimester: Secondary | ICD-10-CM

## 2017-01-11 DIAGNOSIS — Z3A31 31 weeks gestation of pregnancy: Secondary | ICD-10-CM | POA: Insufficient documentation

## 2017-01-11 DIAGNOSIS — O403XX0 Polyhydramnios, third trimester, not applicable or unspecified: Secondary | ICD-10-CM

## 2017-01-11 DIAGNOSIS — Z8279 Family history of other congenital malformations, deformations and chromosomal abnormalities: Secondary | ICD-10-CM | POA: Insufficient documentation

## 2017-01-11 DIAGNOSIS — O99891 Other specified diseases and conditions complicating pregnancy: Secondary | ICD-10-CM

## 2017-01-11 DIAGNOSIS — O358XX0 Maternal care for other (suspected) fetal abnormality and damage, not applicable or unspecified: Secondary | ICD-10-CM | POA: Insufficient documentation

## 2017-01-11 DIAGNOSIS — O09522 Supervision of elderly multigravida, second trimester: Secondary | ICD-10-CM | POA: Insufficient documentation

## 2017-01-11 DIAGNOSIS — O9989 Other specified diseases and conditions complicating pregnancy, childbirth and the puerperium: Secondary | ICD-10-CM | POA: Insufficient documentation

## 2017-01-11 DIAGNOSIS — O0990 Supervision of high risk pregnancy, unspecified, unspecified trimester: Secondary | ICD-10-CM

## 2017-01-14 ENCOUNTER — Encounter: Payer: Self-pay | Admitting: Family Medicine

## 2017-01-18 ENCOUNTER — Encounter (HOSPITAL_COMMUNITY): Payer: Self-pay

## 2017-01-18 ENCOUNTER — Other Ambulatory Visit (HOSPITAL_COMMUNITY): Payer: Self-pay | Admitting: Maternal and Fetal Medicine

## 2017-01-18 ENCOUNTER — Ambulatory Visit (HOSPITAL_COMMUNITY)
Admission: RE | Admit: 2017-01-18 | Discharge: 2017-01-18 | Disposition: A | Discharge status: Home / Self Care | Payer: Self-pay | Source: Ambulatory Visit | Attending: Obstetrics & Gynecology | Admitting: Obstetrics & Gynecology

## 2017-01-18 DIAGNOSIS — O35FXX0 Maternal care for other (suspected) fetal abnormality and damage, fetal musculoskeletal anomalies of trunk, not applicable or unspecified: Secondary | ICD-10-CM

## 2017-01-18 DIAGNOSIS — O99891 Other specified diseases and conditions complicating pregnancy: Secondary | ICD-10-CM

## 2017-01-18 DIAGNOSIS — O358XX0 Maternal care for other (suspected) fetal abnormality and damage, not applicable or unspecified: Secondary | ICD-10-CM

## 2017-01-18 DIAGNOSIS — O403XX0 Polyhydramnios, third trimester, not applicable or unspecified: Secondary | ICD-10-CM | POA: Insufficient documentation

## 2017-01-18 DIAGNOSIS — O9989 Other specified diseases and conditions complicating pregnancy, childbirth and the puerperium: Principal | ICD-10-CM

## 2017-01-18 DIAGNOSIS — Q790 Congenital diaphragmatic hernia: Secondary | ICD-10-CM

## 2017-01-18 DIAGNOSIS — Z3A32 32 weeks gestation of pregnancy: Secondary | ICD-10-CM

## 2017-01-18 DIAGNOSIS — Z8279 Family history of other congenital malformations, deformations and chromosomal abnormalities: Secondary | ICD-10-CM

## 2017-01-18 DIAGNOSIS — Z362 Encounter for other antenatal screening follow-up: Secondary | ICD-10-CM | POA: Insufficient documentation

## 2017-01-18 DIAGNOSIS — O09523 Supervision of elderly multigravida, third trimester: Secondary | ICD-10-CM | POA: Insufficient documentation

## 2017-01-24 ENCOUNTER — Encounter: Payer: Self-pay | Admitting: Family Medicine

## 2017-01-24 ENCOUNTER — Ambulatory Visit (INDEPENDENT_AMBULATORY_CARE_PROVIDER_SITE_OTHER): Payer: Self-pay | Admitting: Obstetrics and Gynecology

## 2017-01-24 VITALS — BP 108/71 | HR 81 | Wt 190.8 lb

## 2017-01-24 DIAGNOSIS — O09292 Supervision of pregnancy with other poor reproductive or obstetric history, second trimester: Secondary | ICD-10-CM

## 2017-01-24 DIAGNOSIS — K0889 Other specified disorders of teeth and supporting structures: Secondary | ICD-10-CM | POA: Insufficient documentation

## 2017-01-24 DIAGNOSIS — O09523 Supervision of elderly multigravida, third trimester: Secondary | ICD-10-CM

## 2017-01-24 DIAGNOSIS — O0990 Supervision of high risk pregnancy, unspecified, unspecified trimester: Secondary | ICD-10-CM

## 2017-01-24 MED ORDER — CYCLOBENZAPRINE HCL 5 MG PO TABS: 5.0000 mg | ORAL_TABLET | Freq: Three times a day (TID) | ORAL | 0 refills | Status: DC | PRN

## 2017-01-24 NOTE — Addendum Note (Signed)
Addended by: Hermina StaggersERVIN, Dalvin Clipper L on: 01/24/2017 04:06 PM   Modules accepted: Orders

## 2017-01-24 NOTE — Progress Notes (Signed)
Subjective:  Amanda Beard is a 41 y.o. [phone] [id number] at 5065w4d being seen today for ongoing prenatal care.  She is currently monitored for the following issues for this high-risk pregnancy and has Abnormal fetal ultrasound; Supervision of high risk pregnancy, antepartum; H/O fetal anomaly in prior pregnancy, currently pregnant, second trimester; AMA (advanced maternal age) multigravida 35+; Preterm labor; and Tooth pain on her problem list.  Patient reports occasional contractions and tooth pain.  Contractions: Irregular. Vag. Bleeding: None.  Movement: Present. Denies leaking of fluid.   The following portions of the patient's history were reviewed and updated as appropriate: allergies, current medications, past family history, past medical history, past social history, past surgical history and problem list. Problem list updated.  Objective:   Vitals:   01/24/17 1517  BP: 108/71  Pulse: 81  Weight: 190 lb 12.8 oz (86.5 kg)    Fetal Status: Fetal Heart Rate (bpm): 139   Movement: Present     General:  Alert, oriented and cooperative. Patient is in no acute distress.  Skin: Skin is warm and dry. No rash noted.   Cardiovascular: Normal heart rate noted  Respiratory: Normal respiratory effort, no problems with respiration noted  Abdomen: Soft, gravid, appropriate for gestational age. Pain/Pressure: Present     Pelvic:  Cervical exam deferred        Extremities: Normal range of motion.  Edema: None  Mental Status: Normal mood and affect. Normal behavior. Normal judgment and thought content.   Urinalysis:      Assessment and Plan:  Pregnancy: [id number] at 465w4d  1. Elderly multigravida in third trimester Low risk NIPS  2. H/O fetal anomaly in prior pregnancy, currently pregnant, second trimester Diaphragmatic hernia [facility]  [facility]  3. Supervision of high risk pregnancy, antepartum Stable  4. Preterm labor  with preterm delivery, single or unspecified fetus Continue with Procardia  5. Tooth pain Dental letter provided to pt  Preterm labor symptoms and general obstetric precautions including but not limited to vaginal bleeding, contractions, leaking of fluid and fetal movement were reviewed in detail with the patient. Please refer to After Visit Summary for other counseling recommendations.  No Follow-up on file.   Hermina StaggersErvin, Mory Herrman L, MD

## 2017-01-25 ENCOUNTER — Encounter (HOSPITAL_COMMUNITY): Payer: Self-pay

## 2017-01-25 ENCOUNTER — Ambulatory Visit (HOSPITAL_COMMUNITY)
Admission: RE | Admit: 2017-01-25 | Discharge: 2017-01-25 | Disposition: A | Discharge status: Home / Self Care | Payer: Self-pay | Source: Ambulatory Visit | Attending: Obstetrics & Gynecology | Admitting: Obstetrics & Gynecology

## 2017-01-25 DIAGNOSIS — O359XX0 Maternal care for (suspected) fetal abnormality and damage, unspecified, not applicable or unspecified: Secondary | ICD-10-CM | POA: Insufficient documentation

## 2017-01-25 DIAGNOSIS — O0990 Supervision of high risk pregnancy, unspecified, unspecified trimester: Secondary | ICD-10-CM

## 2017-01-25 DIAGNOSIS — Z8279 Family history of other congenital malformations, deformations and chromosomal abnormalities: Secondary | ICD-10-CM | POA: Insufficient documentation

## 2017-01-25 DIAGNOSIS — O403XX0 Polyhydramnios, third trimester, not applicable or unspecified: Secondary | ICD-10-CM | POA: Insufficient documentation

## 2017-01-25 DIAGNOSIS — Z3A33 33 weeks gestation of pregnancy: Secondary | ICD-10-CM | POA: Insufficient documentation

## 2017-01-25 DIAGNOSIS — O09523 Supervision of elderly multigravida, third trimester: Secondary | ICD-10-CM | POA: Insufficient documentation

## 2017-01-25 HISTORY — DX: Dental caries, unspecified: K02.9

## 2017-01-28 ENCOUNTER — Other Ambulatory Visit (HOSPITAL_COMMUNITY): Payer: Self-pay | Admitting: *Deleted

## 2017-01-28 DIAGNOSIS — O409XX0 Polyhydramnios, unspecified trimester, not applicable or unspecified: Secondary | ICD-10-CM

## 2017-01-31 ENCOUNTER — Ambulatory Visit (HOSPITAL_COMMUNITY)
Admission: RE | Admit: 2017-01-31 | Discharge: 2017-01-31 | Disposition: A | Discharge status: Home / Self Care | Payer: Self-pay | Source: Ambulatory Visit | Attending: Obstetrics and Gynecology | Admitting: Obstetrics and Gynecology

## 2017-01-31 DIAGNOSIS — O9989 Other specified diseases and conditions complicating pregnancy, childbirth and the puerperium: Secondary | ICD-10-CM | POA: Insufficient documentation

## 2017-01-31 DIAGNOSIS — O403XX0 Polyhydramnios, third trimester, not applicable or unspecified: Secondary | ICD-10-CM | POA: Insufficient documentation

## 2017-01-31 DIAGNOSIS — Z3A34 34 weeks gestation of pregnancy: Secondary | ICD-10-CM | POA: Insufficient documentation

## 2017-01-31 DIAGNOSIS — Q790 Congenital diaphragmatic hernia: Secondary | ICD-10-CM | POA: Insufficient documentation

## 2017-01-31 DIAGNOSIS — Z8279 Family history of other congenital malformations, deformations and chromosomal abnormalities: Secondary | ICD-10-CM | POA: Insufficient documentation

## 2017-01-31 DIAGNOSIS — O09523 Supervision of elderly multigravida, third trimester: Secondary | ICD-10-CM | POA: Insufficient documentation

## 2017-01-31 MED ORDER — BETAMETHASONE SOD PHOS & ACET 6 (3-3) MG/ML IJ SUSP
12.0000 mg | Freq: Once | INTRAMUSCULAR | Status: AC
Start: 2017-01-31 — End: 2017-01-31
  Administered 2017-01-31: 12 mg via INTRAMUSCULAR
  Filled 2017-01-31: qty 2

## 2017-02-01 ENCOUNTER — Encounter (HOSPITAL_COMMUNITY): Payer: Self-pay

## 2017-02-01 ENCOUNTER — Ambulatory Visit (HOSPITAL_COMMUNITY)
Admission: RE | Admit: 2017-02-01 | Discharge: 2017-02-01 | Disposition: A | Discharge status: Home / Self Care | Payer: Self-pay | Source: Ambulatory Visit | Attending: Obstetrics and Gynecology | Admitting: Obstetrics and Gynecology

## 2017-02-01 DIAGNOSIS — O09523 Supervision of elderly multigravida, third trimester: Secondary | ICD-10-CM

## 2017-02-01 DIAGNOSIS — O0990 Supervision of high risk pregnancy, unspecified, unspecified trimester: Secondary | ICD-10-CM

## 2017-02-01 DIAGNOSIS — O409XX0 Polyhydramnios, unspecified trimester, not applicable or unspecified: Secondary | ICD-10-CM

## 2017-02-04 ENCOUNTER — Other Ambulatory Visit (HOSPITAL_COMMUNITY): Payer: Self-pay | Admitting: *Deleted

## 2017-02-04 DIAGNOSIS — O09523 Supervision of elderly multigravida, third trimester: Secondary | ICD-10-CM

## 2017-02-07 ENCOUNTER — Encounter: Payer: Self-pay | Admitting: Family Medicine

## 2017-02-08 ENCOUNTER — Ambulatory Visit (HOSPITAL_COMMUNITY)
Admission: RE | Admit: 2017-02-08 | Discharge: 2017-02-08 | Disposition: A | Discharge status: Home / Self Care | Payer: Self-pay | Source: Ambulatory Visit | Attending: Obstetrics and Gynecology | Admitting: Obstetrics and Gynecology

## 2017-02-08 ENCOUNTER — Encounter (HOSPITAL_COMMUNITY): Payer: Self-pay

## 2017-02-08 DIAGNOSIS — O09523 Supervision of elderly multigravida, third trimester: Secondary | ICD-10-CM | POA: Insufficient documentation

## 2017-02-08 DIAGNOSIS — Z3A35 35 weeks gestation of pregnancy: Secondary | ICD-10-CM | POA: Insufficient documentation

## 2017-02-08 DIAGNOSIS — O403XX0 Polyhydramnios, third trimester, not applicable or unspecified: Secondary | ICD-10-CM | POA: Insufficient documentation

## 2017-02-08 DIAGNOSIS — O409XX0 Polyhydramnios, unspecified trimester, not applicable or unspecified: Secondary | ICD-10-CM

## 2017-02-08 DIAGNOSIS — O0990 Supervision of high risk pregnancy, unspecified, unspecified trimester: Secondary | ICD-10-CM

## 2017-02-13 ENCOUNTER — Observation Stay (HOSPITAL_COMMUNITY)
Admission: AD | Admit: 2017-02-13 | Discharge: 2017-02-13 | Disposition: A | Discharge status: Other Acute Inpatient Hospital | Payer: Self-pay | Source: Ambulatory Visit | Attending: Obstetrics & Gynecology | Admitting: Obstetrics & Gynecology

## 2017-02-13 ENCOUNTER — Encounter (HOSPITAL_COMMUNITY): Payer: Self-pay

## 2017-02-13 DIAGNOSIS — O403XX0 Polyhydramnios, third trimester, not applicable or unspecified: Secondary | ICD-10-CM | POA: Insufficient documentation

## 2017-02-13 DIAGNOSIS — O09523 Supervision of elderly multigravida, third trimester: Secondary | ICD-10-CM | POA: Insufficient documentation

## 2017-02-13 DIAGNOSIS — O358XX0 Maternal care for other (suspected) fetal abnormality and damage, not applicable or unspecified: Secondary | ICD-10-CM | POA: Insufficient documentation

## 2017-02-13 DIAGNOSIS — O0990 Supervision of high risk pregnancy, unspecified, unspecified trimester: Secondary | ICD-10-CM

## 2017-02-13 DIAGNOSIS — Z3A36 36 weeks gestation of pregnancy: Secondary | ICD-10-CM | POA: Insufficient documentation

## 2017-02-13 LAB — CBC
HEMATOCRIT: 35.3 % — AB (ref 36.0–46.0)
HEMOGLOBIN: 12.4 g/dL (ref 12.0–15.0)
MCH: 32.8 pg (ref 26.0–34.0)
MCHC: 35.1 g/dL (ref 30.0–36.0)
MCV: 93.4 fL (ref 78.0–100.0)
Platelets: 184 10*3/uL (ref 150–400)
RBC: 3.78 MIL/uL — AB (ref 3.87–5.11)
RDW: 13.2 % (ref 11.5–15.5)
WBC: 7.3 10*3/uL (ref 4.0–10.5)

## 2017-02-13 MED ORDER — EPHEDRINE 5 MG/ML INJ: 10.0000 mg | INTRAVENOUS | Status: DC | PRN

## 2017-02-13 MED ORDER — ONDANSETRON HCL 4 MG/2ML IJ SOLN: 4.0000 mg | Freq: Four times a day (QID) | INTRAMUSCULAR | Status: DC | PRN

## 2017-02-13 MED ORDER — LIDOCAINE HCL (PF) 1 % IJ SOLN: 30.0000 mL | INTRAMUSCULAR | Status: DC | PRN

## 2017-02-13 MED ORDER — TERBUTALINE SULFATE 1 MG/ML IJ SOLN
0.2500 mg | Freq: Once | INTRAMUSCULAR | Status: AC
  Administered 2017-02-13: 0.25 mg via SUBCUTANEOUS
  Filled 2017-02-13: qty 1

## 2017-02-13 MED ORDER — OXYTOCIN BOLUS FROM INFUSION: 500.0000 mL | Freq: Once | INTRAVENOUS | Status: DC

## 2017-02-13 MED ORDER — LACTATED RINGERS IV SOLN: 500.0000 mL | Freq: Once | INTRAVENOUS | Status: DC

## 2017-02-13 MED ORDER — OXYCODONE-ACETAMINOPHEN 5-325 MG PO TABS
1.0000 | ORAL_TABLET | ORAL | Status: DC | PRN
Start: 2017-02-13 — End: 2017-02-13

## 2017-02-13 MED ORDER — SOD CITRATE-CITRIC ACID 500-334 MG/5ML PO SOLN: 30.0000 mL | ORAL | Status: DC | PRN

## 2017-02-13 MED ORDER — DIPHENHYDRAMINE HCL 50 MG/ML IJ SOLN: 12.5000 mg | INTRAMUSCULAR | Status: DC | PRN

## 2017-02-13 MED ORDER — ACETAMINOPHEN 325 MG PO TABS: 650.0000 mg | ORAL_TABLET | ORAL | Status: DC | PRN

## 2017-02-13 MED ORDER — LACTATED RINGERS IV SOLN
INTRAVENOUS | Status: DC
  Administered 2017-02-13: 17:00:00 via INTRAVENOUS

## 2017-02-13 MED ORDER — TERBUTALINE SULFATE 1 MG/ML IJ SOLN
0.2500 mg | Freq: Once | INTRAMUSCULAR | Status: AC
  Administered 2017-02-13: 0.25 mg via SUBCUTANEOUS

## 2017-02-13 MED ORDER — OXYCODONE-ACETAMINOPHEN 5-325 MG PO TABS: 2.0000 | ORAL_TABLET | ORAL | Status: DC | PRN

## 2017-02-13 MED ORDER — OXYTOCIN 40 UNITS IN LACTATED RINGERS INFUSION - SIMPLE MED: 2.5000 [IU]/h | INTRAVENOUS | Status: DC

## 2017-02-13 MED ORDER — OXYTOCIN 40 UNITS IN LACTATED RINGERS INFUSION - SIMPLE MED
INTRAVENOUS | Status: AC
  Filled 2017-02-13: qty 1000

## 2017-02-13 MED ORDER — LACTATED RINGERS IV SOLN: 500.0000 mL | INTRAVENOUS | Status: DC | PRN

## 2017-02-13 MED ORDER — TERBUTALINE SULFATE 1 MG/ML IJ SOLN
INTRAMUSCULAR | Status: AC
  Filled 2017-02-13: qty 2

## 2017-02-13 MED ORDER — PHENYLEPHRINE 40 MCG/ML (10ML) SYRINGE FOR IV PUSH (FOR BLOOD PRESSURE SUPPORT): 80.0000 ug | PREFILLED_SYRINGE | INTRAVENOUS | Status: DC | PRN

## 2017-02-13 MED ORDER — FENTANYL CITRATE (PF) 100 MCG/2ML IJ SOLN: 100.0000 ug | INTRAMUSCULAR | Status: DC | PRN

## 2017-02-13 MED ORDER — MAGNESIUM SULFATE 40 G IN LACTATED RINGERS - SIMPLE
2.0000 g/h | INTRAVENOUS | Status: DC
  Filled 2017-02-13: qty 500

## 2017-02-13 MED ORDER — FLEET ENEMA 7-19 GM/118ML RE ENEM: 1.0000 | ENEMA | RECTAL | Status: DC | PRN

## 2017-02-13 MED ORDER — TERBUTALINE SULFATE 1 MG/ML IJ SOLN
INTRAMUSCULAR | Status: DC
Start: 2017-02-13 — End: 2017-02-13
  Filled 2017-02-13: qty 1

## 2017-02-13 MED ORDER — MAGNESIUM SULFATE BOLUS VIA INFUSION
4.0000 g | Freq: Once | INTRAVENOUS | Status: AC
Start: 2017-02-13 — End: 2017-02-13
  Administered 2017-02-13: 4 g via INTRAVENOUS
  Filled 2017-02-13: qty 500

## 2017-02-13 MED ORDER — FENTANYL 2.5 MCG/ML BUPIVACAINE 1/10 % EPIDURAL INFUSION (WH - ANES): 14.0000 mL/h | INTRAMUSCULAR | Status: DC | PRN

## 2017-02-13 NOTE — Progress Notes (Signed)
Patient ID: Amanda [name], female   DOB: [birth year], 41 y.o.   MRN: [id number] Late entry:   Called to patient room in MAU for possible imminent delivery.  Spanish-speaking [id number] at 1472w3d here for contractions. Delivery planned for Amanda Beard due to diaphragmatic hernia. Hx rapid labor Fetal membranes hourglassing through introitus with ROM at 1530> large amount clear AF. SVE: 7/80/ ballotable head. NICU notified.  Accompanied on transfer to LDU where Dr. Macon LargeAnyanwu assumed care.  Danae Orleansoe, Bryndle Corredor C, CNM 02/13/2017 5:53 PM

## 2017-02-13 NOTE — Progress Notes (Signed)
Patient ID: Amanda Beard, female   DOB: May 12, 1976, 41 y.o.   MRN: [id number]   The patient presented approximately 1645 with advanced labor with BBOW at vaginal introitus She experienced SROM and is 6-7 cm still pretty thick an dballotable FHR strip Category I no decels I had a long in depth talk with pt and husband regarding risks and benefits I know it is a huge risk but one that is worht if possibly for the baby  I have worked the last 2 hours to arrange transfer of patient  I had a long talk with patient and husband and they understand the disaster that would happen if she delivered en route however the chances of survival is low if ECMO is not readily available  The patient her husband and I decided that the risk is great but the risk is worth it for fetal reasons  Drs Katherina Rightenny and Judeth PorchNietsche and I have consulted  They did reinforce with me the risk of delivery en route which I acknowledged The risk is entirely mine until we reach our destination facility and told them they have no risk, they have no risk until we arrive  I have an Land D nurse and a RT and a NICU NP going with us  Lazaro ArmsEURE,Jonavon Trieu H, MD 02/13/2017 6:48 PM

## 2017-02-13 NOTE — Progress Notes (Signed)
Dr. Despina HiddenEure [facility] with pt [facility]. Dr. Despina HiddenEure coordinated with [facility] team and attempted to get the [facility] team to ride or travel behind [facility].

## 2017-02-13 NOTE — H&P (Signed)
LABOR ADMISSION HISTORY AND PHYSICAL  Amanda [name] is a 41 y.o. female [phone] with IUP at 10671w3d by 6 wk US presenting for SOL pregnancy complicated by AMA, congenital diaphragmatic hernia in fetus, and polyhydramnios. She reports +FMs, LOF @1530 , no VB, no blurry vision, no headaches, no peripheral edema.  She plans on breast feeding. Husband to get vasectomy for birth control.  Dating: By 6 wk [place] --->  Estimated Date of Delivery: 03/10/17  Sono:   @[redacted]w[redacted]d , CWD, congenital diaphragmatic hernia, cephalic presentation; polyhydramnios, 2659g, 56% EFW  Prenatal History/Complications:  Past Medical History: Past Medical History:  Diagnosis Date  . 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  Normal fetal ECHO and NIPT   . Dental caries   . Depression    postpartum 2003  . Headache   . Medical history non-contributory     Past Surgical History: Past Surgical History:  Procedure Laterality Date  . ECTOPIC PREGNANCY SURGERY    . MULTIPLE TOOTH EXTRACTIONS      Obstetrical History: OB History    Gravida Para Term Preterm AB Living   5 3 3   1 3    SAB TAB Ectopic Multiple Live Births       1   3      Social History: Social History   Social History  . Marital status: Significant Other    Spouse name: N/A  . Number of children: N/A  . Years of education: N/A   Social History Main Topics  . Smoking status: Former Smoker    Quit date: 07/23/2000  . Smokeless tobacco: Former NeurosurgeonUser  . Alcohol use No  . Drug use: No  . Sexual activity: Yes    Birth control/ protection: None   Other Topics Concern  . None   Social History Narrative  . None    Family History: Family History  Problem Relation Age of Onset  . Diabetes Mother   . Diabetes Maternal Uncle   . Diabetes Maternal Grandmother   . Cancer Paternal Grandfather     Allergies: No Known Allergies  Prescriptions Prior to Admission  Medication Sig Dispense  Refill Last Dose  . cyclobenzaprine (FLEXERIL) 5 MG tablet Take 1 tablet (5 mg total) by mouth 3 (three) times daily as needed for muscle spasms. 30 tablet 0 02/12/2017 at Unknown time  . NIFEdipine (PROCARDIA-XL/ADALAT CC) 30 MG 24 hr tablet Take 1 tablet (30 mg total) by mouth 2 (two) times daily. 30 tablet 2 02/13/2017 at Unknown time  . Prenatal Vit-Fe Fumarate-FA (PRENATAL VITAMIN) 27-0.8 MG TABS Take 1 tablet by mouth daily. 90 tablet 3 02/12/2017 at Unknown time  . ranitidine (ZANTAC) 150 MG tablet Take 1 tablet (150 mg total) by mouth 2 (two) times daily. 60 tablet 2 02/12/2017 at Unknown time  . acetaminophen (TYLENOL) 500 MG tablet Take 500 mg by mouth every 6 (six) hours as needed for mild pain, moderate pain, fever or headache.   02/08/2017     Review of Systems   All systems reviewed and negative except as stated in HPI  Blood pressure 130/82, pulse 75, temperature 99.1 F (37.3 C), temperature source Oral, resp. rate 18, last menstrual period 05/08/2016. General appearance: alert and cooperative Extremities: Homans sign is negative, no sign of DVT Presentation: cephalic Fetal monitoringBaseline: 150 bpm, Variability: Good {> 6 bpm), Accelerations: Reactive and Decelerations: Absent Uterine activityFrequency: Every 1-2 minutes Dilation: 7 Exam by:: Erven CollaMelissa Wilkins RNC   Prenatal  labs: ABO, Rh: --/--/O POS (06/04 1313) Antibody: NEG (06/04 1313) Rubella: Immune RPR: Non Reactive (05/15 0916)  HBsAg: Negative (01/24 0000)  HIV: Non-reactive (01/24 0000)  GBS:  negative GTT: Fasting- 83, 1 hr- 151, 2 hr- 100  Prenatal Transfer Tool  Maternal Diabetes: No Genetic Screening: Normal Maternal Ultrasounds/Referrals: Abnormal:  Findings:   Other:  CDH with stomach, bowel and liver in chest; left UTD A1 (renal pelvis measured 10 mms). All other interval fetal anatomy was seen and appeared normal; anatomic survey complete; Mild polyhydramnios (S/P second amnioreduction  yesterday) Fetal Ultrasounds or other Referrals:  None Maternal Substance Abuse:  No Significant Maternal Medications:  None Significant Maternal Lab Results: Lab values include: Group B Strep negative  No results found for this or any previous visit (from the past 24 hour(s)).  Patient Active Problem List   Diagnosis Date Noted  . Preterm labor 01/24/2017  . Tooth pain 01/24/2017  . AMA (advanced maternal age) multigravida 35+ 11/04/2016  . H/O fetal anomaly in prior pregnancy, currently pregnant, second trimester 10/24/2016  . Abnormal fetal ultrasound   . Supervision of high risk pregnancy, antepartum     Assessment: Amanda [name] is a 41 y.o. [phone] at 2954w3d here for SOL, pregnancy complicated by AMA, congenital diaphragmatic hernia in fetus and polyhydramnios.  #Labor: Expectant management #Pain: Plan for epidural #FWB: Cat 1 #ID: GBS neg #MOF: breast #MOC:vasectomy #Circ: No #CDH: [facility]- Plan for mom to have early discharge if uncomplicated postpartum course.  [place] Shirley, DO Family Medicine Resident PGY-1  02/13/2017, 5:02 PM   OB FELLOW HISTORY AND PHYSICAL ATTESTATION  I confirm that I have verified the information documented in the resident's note and that I have also personally reperformed the physical exam and all medical decision making activities.   Caryl AdaJazma Phelps, DO 02/13/2017, 5:33 PM

## 2017-02-14 LAB — RPR: RPR: NONREACTIVE

## 2017-02-15 ENCOUNTER — Ambulatory Visit (HOSPITAL_COMMUNITY): Admission: RE | Admit: 2017-02-15 | Payer: MEDICAID | Source: Ambulatory Visit

## 2017-02-22 ENCOUNTER — Ambulatory Visit (HOSPITAL_COMMUNITY): Admission: RE | Admit: 2017-02-22 | Payer: MEDICAID | Source: Ambulatory Visit

## 2017-03-01 ENCOUNTER — Ambulatory Visit (HOSPITAL_COMMUNITY): Payer: MEDICAID

## 2017-03-13 NOTE — Discharge Summary (Signed)
Physician Discharge Summary  Patient ID: Amanda Beard MRN: [id number] DOB/AGE: 08-01-1975 41 y.o.  Admit date: 02/13/2017 Discharge date: 03/13/2017  Admission Diagnoses: The patient presented approximately 1645 with advanced labor with BBOW at vaginal introitus She experienced SROM and is 6-7 cm still pretty thick an dballotable FHR strip Category I no decels I had a long in depth talk with pt and husband regarding risks and benefits I know it is a huge risk but one that is worht if possibly for the baby  I have worked the last 2 hours to arrange transfer of patient  I had a long talk with patient and husband and they understand the disaster that would happen if she delivered en route however the chances of survival is low if ECMO is not readily available  The patient her husband and I decided that the risk is great but the risk is worth it for fetal reasons  Drs Katherina Right and Judeth Porch and I have consulted  They did reinforce with me the risk of delivery en route which I acknowledged The risk is entirely mine until we reach our destination facility and told them they have no risk, they have no risk until we arrive  I have an Land D nurse and a RT and a NICU NP going with [place]  Lazaro Arms, MD 02/13/2017 6:48 PM     Discharge Diagnoses:  Active Problems:   Preterm labor   Labor and delivery, indication for care   Discharged Condition: serious  Hospital Course: See above  Consults: MFM and NICU  Significant Diagnostic Studies: Sonograms  Treatments: IV magnesium for tocolysis  Discharge Exam: Blood pressure (!) 0/0, pulse (!) 0, temperature 98.6 F (37 C), resp. rate 16, height 5\' 5"  (1.651 m), weight 190 lb (86.2 kg), last menstrual period 05/08/2016, SpO2 (!) 0 %. General appearance: alert, cooperative and no distress GI: soft, non-tender; bowel sounds normal; no masses,  no organomegaly Pelvic: 6-7/thick/allotable  Disposition: 88-DC/txfr to fed  health care facility with planned acute hosp IP readmission   Allergies as of 02/13/2017   No Known Allergies     Medication List    TAKE these medications   acetaminophen 500 MG tablet Commonly known as:  TYLENOL Take 500 mg by mouth every 6 (six) hours as needed for mild pain, moderate pain, fever or headache.   cyclobenzaprine 5 MG tablet Commonly known as:  FLEXERIL Take 1 tablet (5 mg total) by mouth 3 (three) times daily as needed for muscle spasms.   NIFEdipine 30 MG 24 hr tablet Commonly known as:  PROCARDIA-XL/ADALAT CC Take 1 tablet (30 mg total) by mouth 2 (two) times daily.   Prenatal Vitamin 27-0.8 MG Tabs Take 1 tablet by mouth daily.   ranitidine 150 MG tablet Commonly known as:  ZANTAC Take 1 tablet (150 mg total) by mouth 2 (two) times daily.        SignedLazaro Arms 03/13/2017, 8:55 PM  I copied and pasted my note from 02/13/2017 because I thought that would serve for the discharge summary however I went ahead and filled about this form as well when I realized that that was an adequate But the the note from 725 does summarize the patient's status and the reason for transfer adequately

## 2017-03-26 ENCOUNTER — Other Ambulatory Visit: Payer: Self-pay | Admitting: Obstetrics & Gynecology

## 2017-03-26 DIAGNOSIS — Z1231 Encounter for screening mammogram for malignant neoplasm of breast: Secondary | ICD-10-CM

## 2017-04-16 ENCOUNTER — Encounter: Payer: Self-pay | Admitting: Obstetrics & Gynecology

## 2018-01-08 ENCOUNTER — Encounter: Payer: Self-pay | Admitting: Podiatry

## 2018-01-19 NOTE — Progress Notes (Signed)
This encounter was created in error - please disregard.

## 2021-02-11 ENCOUNTER — Other Ambulatory Visit: Payer: Self-pay

## 2021-02-11 ENCOUNTER — Ambulatory Visit
Admission: EM | Admit: 2021-02-11 | Discharge: 2021-02-11 | Disposition: A | Discharge status: Home / Self Care | Payer: Self-pay | Attending: Physician Assistant | Admitting: Physician Assistant

## 2021-02-11 ENCOUNTER — Encounter: Payer: Self-pay | Admitting: Emergency Medicine

## 2021-02-11 DIAGNOSIS — M791 Myalgia, unspecified site: Secondary | ICD-10-CM

## 2021-02-11 DIAGNOSIS — L709 Acne, unspecified: Secondary | ICD-10-CM

## 2021-02-11 MED ORDER — IBUPROFEN 800 MG PO TABS: 800.0000 mg | ORAL_TABLET | Freq: Three times a day (TID) | ORAL | 0 refills | Status: DC | PRN

## 2021-02-11 NOTE — ED Triage Notes (Signed)
Pt sts breast pain, cramping and "feeling hot" x 3 days; pt sts has had mirena x 2 months

## 2021-02-11 NOTE — Discharge Instructions (Addendum)
Return if any problems. Try taking prescription dose ibuprofen to help with symptoms.

## 2021-02-12 NOTE — ED Provider Notes (Signed)
[facility]    CSN: [id number] Arrival date & time: 02/11/21  1032      History   Chief Complaint Chief Complaint  Patient presents with   Breast Pain    HPI Amanda Beard is a 45 y.o. female.   Pt reports she had mirena placed and has had problems with cramps, breast tenderness and acne since. Pt's ob told her symptoms will improve over the 1st 6 months.    The history is provided by the patient. No language interpreter was used.   Past Medical History:  Diagnosis Date   Abnormal fetal ultrasound    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  Normal fetal ECHO and NIPT    Dental caries    Depression    postpartum 2003   Headache    Medical history non-contributory     Patient Active Problem List   Diagnosis Date Noted   Labor and delivery, indication for care 02/13/2017   Preterm labor 01/24/2017   Tooth pain 01/24/2017   H/O fetal anomaly in prior pregnancy, currently pregnant, second trimester 10/24/2016   Abnormal fetal ultrasound     Past Surgical History:  Procedure Laterality Date   ECTOPIC PREGNANCY SURGERY     MULTIPLE TOOTH EXTRACTIONS      OB History     Gravida  5   Para  3   Term  3   Preterm      AB  1   Living  3      SAB      IAB      Ectopic  1   Multiple      Live Births  3            Home Medications    Prior to Admission medications   Medication Sig Start Date End Date Taking? Authorizing Provider  ibuprofen (ADVIL) 800 MG tablet Take 1 tablet (800 mg total) by mouth every 8 (eight) hours as needed. 02/11/21  Yes Elson Areas, PA-C  acetaminophen (TYLENOL) 500 MG tablet Take 500 mg by mouth every 6 (six) hours as needed for mild pain, moderate pain, fever or headache.    [provider]  cyclobenzaprine (FLEXERIL) 5 MG tablet Take 1 tablet (5 mg total) by mouth 3 (three) times daily as needed for muscle spasms. Patient not taking: Reported on 02/11/2021 01/24/17    Hermina Staggers, MD  NIFEdipine (PROCARDIA-XL/ADALAT CC) 30 MG 24 hr tablet Take 1 tablet (30 mg total) by mouth 2 (two) times daily. 12/27/16   Lazaro Arms, MD  Prenatal Vit-Fe Fumarate-FA (PRENATAL VITAMIN) 27-0.8 MG TABS Take 1 tablet by mouth daily. 12/03/16   Levie Heritage, DO  ranitidine (ZANTAC) 150 MG tablet Take 1 tablet (150 mg total) by mouth 2 (two) times daily. 12/31/16   Adam Phenix, MD    Family History Family History  Problem Relation Age of Onset   Diabetes Mother    Diabetes Maternal Uncle    Diabetes Maternal Grandmother    Cancer Paternal Grandfather     Social History Social History   Tobacco Use   Smoking status: Former    Types: Cigarettes    Quit date: 07/23/2000    Years since quitting: 20.5   Smokeless tobacco: Former  Substance Use Topics   Alcohol use: No   Drug use: No     Allergies   Patient has no known allergies.   Review of Systems Review of  Systems  Musculoskeletal:  Positive for back pain and myalgias.  Skin:  Positive for rash.  All other systems reviewed and are negative.   Physical Exam Triage Vital Signs ED Triage Vitals  Enc Vitals Group     BP 02/11/21 1335 135/89     Pulse Rate 02/11/21 1335 61     Resp 02/11/21 1335 18     Temp 02/11/21 1335 98.3 F (36.8 C)     Temp Source 02/11/21 1335 Oral     SpO2 02/11/21 1335 98 %     Weight --      Height --      Head Circumference --      Peak Flow --      Pain Score 02/11/21 1336 5     Pain Loc --      Pain Edu? --      Excl. in GC? --    No data found.  Updated Vital Signs BP 135/89 (BP Location: Left Arm)   Pulse 61   Temp 98.3 F (36.8 C) (Oral)   Resp 18   SpO2 98%   Visual Acuity Right Eye Distance:   Left Eye Distance:   Bilateral Distance:    Right Eye Near:   Left Eye Near:    Bilateral Near:     Physical Exam Constitutional:      Appearance: Normal appearance.  Cardiovascular:     Rate and Rhythm: Normal rate.  Pulmonary:     Effort:  Pulmonary effort is normal.  Skin:    Findings: Rash present.  Neurological:     General: No focal deficit present.     Mental Status: She is alert.  Psychiatric:        Mood and Affect: Mood normal.     UC Treatments / Results  Labs (all labs ordered are listed, but only abnormal results are displayed) Labs Reviewed - No data to display  EKG   Radiology No results found.  Procedures Procedures (including critical care time)  Medications Ordered in UC Medications - No data to display  Initial Impression / Assessment and Plan / UC Course  I have reviewed the triage vital signs and the nursing notes.  Pertinent labs & imaging results that were available during my care of the patient were reviewed by me and considered in my medical decision making (see chart for details).     MDM:  Pt given rx for ibuprofen.  I advised discuss symptoms with ob/gyn.  Possible dermatologist  Final Clinical Impressions(s) / UC Diagnoses   Final diagnoses:  Myalgia  Acne, unspecified acne type     Discharge Instructions      Return if any problems. Try taking prescription dose ibuprofen to help with symptoms.     ED Prescriptions     Medication Sig Dispense Auth. Provider   ibuprofen (ADVIL) 800 MG tablet Take 1 tablet (800 mg total) by mouth every 8 (eight) hours as needed. 30 tablet Elson Areas, [place]      PDMP not reviewed this encounter. An After Visit Summary was printed and given to the patient.    Elson Areas, [place] 02/12/21 [phone]

## 2021-04-21 ENCOUNTER — Ambulatory Visit
Admission: EM | Admit: 2021-04-21 | Discharge: 2021-04-21 | Disposition: A | Discharge status: Home / Self Care | Payer: Self-pay | Attending: Urgent Care | Admitting: Urgent Care

## 2021-04-21 DIAGNOSIS — L03011 Cellulitis of right finger: Secondary | ICD-10-CM

## 2021-04-21 MED ORDER — CEPHALEXIN 500 MG PO CAPS: 500.0000 mg | ORAL_CAPSULE | Freq: Three times a day (TID) | ORAL | 0 refills | Status: AC

## 2021-04-21 NOTE — ED Provider Notes (Signed)
[facility]   MRN: [id number] DOB: 1975/11/21  Subjective:   Amanda Beard is a 45 y.o. female presenting for 2 to 3-day history of persistent and worsening right middle finger pain.  Patient states that she applied an acrylic nail on that finger and as it started to hurt she did.  She has noticed the nail looks very different and has been persistently hurting her.  Denies fever, drainage of pus or bleeding.  No current facility-administered medications for this encounter.  Current Outpatient Medications:    acetaminophen (TYLENOL) 500 MG tablet, Take 500 mg by mouth every 6 (six) hours as needed for mild pain, moderate pain, fever or headache., Disp: , Rfl:    ibuprofen (ADVIL) 800 MG tablet, Take 1 tablet (800 mg total) by mouth every 8 (eight) hours as needed., Disp: 30 tablet, Rfl: 0   NIFEdipine (PROCARDIA-XL/ADALAT CC) 30 MG 24 hr tablet, Take 1 tablet (30 mg total) by mouth 2 (two) times daily., Disp: 30 tablet, Rfl: 2   Prenatal Vit-Fe Fumarate-FA (PRENATAL VITAMIN) 27-0.8 MG TABS, Take 1 tablet by mouth daily., Disp: 90 tablet, Rfl: 3   ranitidine (ZANTAC) 150 MG tablet, Take 1 tablet (150 mg total) by mouth 2 (two) times daily., Disp: 60 tablet, Rfl: 2   No Known Allergies  Past Medical History:  Diagnosis Date   Abnormal fetal ultrasound    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  Normal fetal ECHO and NIPT    Dental caries    Depression    postpartum 2003   Headache    Medical history non-contributory      Past Surgical History:  Procedure Laterality Date   ECTOPIC PREGNANCY SURGERY     MULTIPLE TOOTH EXTRACTIONS      Family History  Problem Relation Age of Onset   Diabetes Mother    Diabetes Maternal Uncle    Diabetes Maternal Grandmother    Cancer Paternal Grandfather     Social History   Tobacco Use   Smoking status: Former    Types: Cigarettes    Quit date: 07/23/2000    Years since quitting: 20.7    Smokeless tobacco: Former  Substance Use Topics   Alcohol use: No   Drug use: No    ROS   Objective:   Vitals: BP 129/80 (BP Location: Left Arm)   Pulse 65   Temp 98.7 F (37.1 C) (Oral)   Resp 18   SpO2 96%   Physical Exam Constitutional:      General: She is not in acute distress.    Appearance: Normal appearance. She is well-developed. She is not ill-appearing, toxic-appearing or diaphoretic.  HENT:     Head: Normocephalic and atraumatic.     Nose: Nose normal.     Mouth/Throat:     Mouth: Mucous membranes are moist.     Pharynx: Oropharynx is clear.  Eyes:     General: No scleral icterus.       Right eye: No discharge.        Left eye: No discharge.     Extraocular Movements: Extraocular movements intact.     Conjunctiva/sclera: Conjunctivae normal.     Pupils: Pupils are equal, round, and reactive to light.  Cardiovascular:     Rate and Rhythm: Normal rate.  Pulmonary:     Effort: Pulmonary effort is normal.  Musculoskeletal:       Hands:  Skin:    General: Skin is warm and dry.  Neurological:  General: No focal deficit present.     Mental Status: She is alert and oriented to person, place, and time.  Psychiatric:        Mood and Affect: Mood normal.        Behavior: Behavior normal.        Thought Content: Thought content normal.        Judgment: Judgment normal.    Verbal consent obtained.  Iodine wiped over the third right fingernail and finger.  An 18-gauge needle was used to lance the middle finger nail. ~1cc purulence expressed.  Wound cleansed.  Pressure dressing applied.  Assessment and Plan :   PDMP not reviewed this encounter.  1. Abscess of fingernail, right     Successful I&D of abscess.  Recommended Keflex, Tylenol for pain control.  Reviewed wound care. Counseled patient on potential for adverse effects with medications prescribed/recommended today, ER and return-to-clinic precautions discussed, patient verbalized  understanding.    Wallis Bamberg, [place] 04/21/21 [phone]

## 2021-04-21 NOTE — ED Triage Notes (Signed)
Pt c/o pain and discoloration to rt middle finger and nail after having acrylic nail removed.

## 2024-05-20 ENCOUNTER — Telehealth: Payer: Self-pay | Admitting: Nurse Practitioner

## 2024-05-20 DIAGNOSIS — L719 Rosacea, unspecified: Secondary | ICD-10-CM

## 2024-05-20 DIAGNOSIS — F419 Anxiety disorder, unspecified: Secondary | ICD-10-CM

## 2024-05-20 MED ORDER — DOXYCYCLINE HYCLATE 100 MG PO TABS: 50.0000 mg | ORAL_TABLET | Freq: Every day | ORAL | 0 refills | Status: AC

## 2024-05-20 MED ORDER — SERTRALINE HCL 50 MG PO TABS: 50.0000 mg | ORAL_TABLET | Freq: Every day | ORAL | 0 refills | Status: DC

## 2024-05-20 MED ORDER — HYDROXYZINE PAMOATE 50 MG PO CAPS: 50.0000 mg | ORAL_CAPSULE | Freq: Every evening | ORAL | 0 refills | Status: DC | PRN

## 2024-05-20 NOTE — Progress Notes (Signed)
 Acute Video Visit    Virtual Visit Consent:   Amanda Beard, you are scheduled for a virtual visit with a [facility] provider today.     Just as with appointments in the office, your consent must be obtained to participate.  Your consent will be active for this visit and any virtual visit you may have with one of our providers in the next 365 days.     If you have a MyChart account, a copy of this consent can be sent to you electronically.  All virtual visits are billed to your insurance company just like a traditional visit in the office.    If the connection with a video visit is poor, the visit may have to be switched to a telephone visit.  With either a video or telephone visit, we are not always able to ensure that we have a secure connection.     I need to obtain your verbal consent now.   Are you willing to proceed with your visit today?    Amanda Beard has provided verbal consent on 05/20/2024 for a virtual visit (video or telephone).   Lauraine Kitty, FNP  Date: 05/20/2024 10:53 AM  Subjective:     Patient ID: Amanda Beard Amanda Beard, female    DOB: January 01, 1976, 48 y.o.   MRN: [id number]  I, Lauraine Kitty, connected with  Amanda Beard  ([id number], 1975/11/21) on 05/20/24 at 11:20 AM EDT by a video-enabled telemedicine application and verified that I am speaking with the correct person using two identifiers.   Location:  Patient: [facility]  Provider: Virtual Visit Location Provider: Home Office   I discussed the limitations of evaluation and management by telemedicine and the availability of in person appointments. The patient expressed understanding and agreed to proceed.     HPI  Amanda Beard is a 48 y.o. who identifies as a female who was assigned female at birth, and is being seen today for assistance with medication refills while awaiting new patient appointment with PCP next month.   She has been a patient at the [facility] with Amanda Beard in the past, will be transitioning to [facility] and wellness next month and is requesting a 30 day refill on medications until that time   Medical history is significant for rosacea and anxiety.   She has been managed on Zoloft without SE, and has used hydroxyzine as needed for sleep in the past.   She would like to continue these medications, and will continue to receive support from the [facility] for SDOH needs  Denies any acute needs today       Objective:    Physical Exam Constitutional:      General: She is not in acute distress.    Appearance: Normal appearance. She is not ill-appearing.  HENT:     Nose: Nose normal.     Mouth/Throat:     Mouth: Mucous membranes are moist.  Pulmonary:     Effort: Pulmonary effort is normal.  Neurological:     Mental Status: She is alert and oriented to person, place, and time.  Psychiatric:        Attention and Perception: Attention normal.        Mood and Affect: Mood normal.        Speech: Speech normal.        Behavior: Behavior normal.        Thought Content: Thought content does not  include homicidal or suicidal ideation.        Cognition and Memory: Cognition normal.        Judgment: Judgment normal.          Assessment & Plan:    1. Anxiety (Primary)  - hydrOXYzine (VISTARIL) 50 MG capsule; Take 1 capsule (50 mg total) by mouth at bedtime as needed for anxiety.  Dispense: 30 capsule; Refill: 0 - sertraline (ZOLOFT) 50 MG tablet; Take 1 tablet (50 mg total) by mouth daily.  Dispense: 30 tablet; Refill: 0  2. Acne rosacea  - doxycycline (VIBRA-TABS) 100 MG tablet; Take 0.5 tablets (50 mg total) by mouth daily.  Dispense: 15 tablet; Refill: 0  Follow Up Instructions: I discussed the assessment and treatment plan with the patient. The patient was provided an opportunity to ask questions and all were answered. The patient agreed with the plan and demonstrated an understanding of the instructions.  A copy of  instructions were sent to the patient via MyChart unless otherwise noted below.     The patient was advised to call back or seek an in-person evaluation if the symptoms worsen or if the condition fails to improve as anticipated.    Lauraine Kitty, FNP  **Disclaimer: This note may have been dictated with voice recognition software. Similar sounding words can inadvertently be transcribed and this note may contain transcription errors which may not have been corrected upon publication of note.**

## 2024-06-30 ENCOUNTER — Other Ambulatory Visit: Payer: Self-pay | Admitting: Nurse Practitioner

## 2024-06-30 DIAGNOSIS — F419 Anxiety disorder, unspecified: Secondary | ICD-10-CM

## 2024-06-30 MED ORDER — HYDROXYZINE PAMOATE 50 MG PO CAPS: 50.0000 mg | ORAL_CAPSULE | Freq: Every evening | ORAL | 0 refills | Status: AC | PRN

## 2024-06-30 MED ORDER — SERTRALINE HCL 50 MG PO TABS: 50.0000 mg | ORAL_TABLET | Freq: Every day | ORAL | 0 refills | Status: AC

## 2024-07-31 NOTE — Progress Notes (Unsigned)
 Pt attended 05/20/2024 [facility] Appointment.   Per initial f/u pt was reached out via phone and was left vm via interpreter services.  During the appt provider noted, She has been a patient at the [facility] with Ronal Jenkins Houseman in the past, will be transitioning to [facility] and wellness next month.SABRASABRAShe would like to continue these medications, and will continue to receive support from the [facility] for SDOH needs. Pt was mailed letter with [facility] Now flyer, [facility] flyer, & [place] [address] card.  Per chart review pt does have a PCP Alston Jenkins Placey; [facility] - no [facility]- visible visits), unable to verify if pt has insurance, and is not a smoker. Pt does not indicate any SDOH needs at this time.  Additional pt f/u to be scheduled at this time per health equity protocol to verify PCP.
